# Patient Record
Sex: Female | Born: 1960
Health system: Southern US, Community
[De-identification: ages and names within clinical notes are randomized; demographics above are authoritative.]

## PROBLEM LIST (undated history)

## (undated) DIAGNOSIS — R7303 Prediabetes: Secondary | ICD-10-CM

## (undated) DIAGNOSIS — E119 Type 2 diabetes mellitus without complications: Secondary | ICD-10-CM

## (undated) DIAGNOSIS — I10 Essential (primary) hypertension: Secondary | ICD-10-CM

## (undated) HISTORY — PX: ABDOMINAL HYSTERECTOMY: SHX81

## (undated) HISTORY — DX: Essential (primary) hypertension: I10

---
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
## 2012-03-19 DIAGNOSIS — J309 Allergic rhinitis, unspecified: Secondary | ICD-10-CM | POA: Insufficient documentation

## 2012-03-19 DIAGNOSIS — H6590 Unspecified nonsuppurative otitis media, unspecified ear: Secondary | ICD-10-CM | POA: Insufficient documentation

## 2012-04-12 DIAGNOSIS — R7303 Prediabetes: Secondary | ICD-10-CM | POA: Insufficient documentation

## 2015-07-02 DIAGNOSIS — Z0000 Encounter for general adult medical examination without abnormal findings: Secondary | ICD-10-CM | POA: Insufficient documentation

## 2015-07-17 DIAGNOSIS — I10 Essential (primary) hypertension: Secondary | ICD-10-CM | POA: Insufficient documentation

## 2015-11-22 DIAGNOSIS — R7303 Prediabetes: Secondary | ICD-10-CM | POA: Diagnosis not present

## 2015-11-22 DIAGNOSIS — I10 Essential (primary) hypertension: Secondary | ICD-10-CM | POA: Diagnosis not present

## 2015-11-22 DIAGNOSIS — F321 Major depressive disorder, single episode, moderate: Secondary | ICD-10-CM | POA: Diagnosis not present

## 2015-11-22 DIAGNOSIS — F4321 Adjustment disorder with depressed mood: Secondary | ICD-10-CM | POA: Diagnosis not present

## 2016-01-03 DIAGNOSIS — I10 Essential (primary) hypertension: Secondary | ICD-10-CM | POA: Diagnosis not present

## 2016-01-06 DIAGNOSIS — F4321 Adjustment disorder with depressed mood: Secondary | ICD-10-CM | POA: Diagnosis not present

## 2016-01-06 DIAGNOSIS — Z634 Disappearance and death of family member: Secondary | ICD-10-CM | POA: Diagnosis not present

## 2016-01-06 DIAGNOSIS — I10 Essential (primary) hypertension: Secondary | ICD-10-CM | POA: Diagnosis not present

## 2016-01-06 DIAGNOSIS — R7303 Prediabetes: Secondary | ICD-10-CM | POA: Diagnosis not present

## 2016-01-12 DIAGNOSIS — Z79899 Other long term (current) drug therapy: Secondary | ICD-10-CM | POA: Diagnosis not present

## 2016-01-12 DIAGNOSIS — I10 Essential (primary) hypertension: Secondary | ICD-10-CM | POA: Diagnosis not present

## 2016-01-12 DIAGNOSIS — S81012A Laceration without foreign body, left knee, initial encounter: Secondary | ICD-10-CM | POA: Diagnosis not present

## 2016-03-16 DIAGNOSIS — Z789 Other specified health status: Secondary | ICD-10-CM | POA: Diagnosis not present

## 2016-03-16 DIAGNOSIS — Z9229 Personal history of other drug therapy: Secondary | ICD-10-CM | POA: Diagnosis not present

## 2016-03-17 DIAGNOSIS — Z23 Encounter for immunization: Secondary | ICD-10-CM | POA: Diagnosis not present

## 2016-03-19 DIAGNOSIS — Z23 Encounter for immunization: Secondary | ICD-10-CM | POA: Diagnosis not present

## 2016-09-18 DIAGNOSIS — S61431A Puncture wound without foreign body of right hand, initial encounter: Secondary | ICD-10-CM | POA: Diagnosis not present

## 2016-09-18 DIAGNOSIS — I10 Essential (primary) hypertension: Secondary | ICD-10-CM | POA: Diagnosis not present

## 2016-09-18 DIAGNOSIS — S61451A Open bite of right hand, initial encounter: Secondary | ICD-10-CM | POA: Diagnosis not present

## 2016-09-18 DIAGNOSIS — S61411A Laceration without foreign body of right hand, initial encounter: Secondary | ICD-10-CM | POA: Diagnosis not present

## 2016-09-18 DIAGNOSIS — M25531 Pain in right wrist: Secondary | ICD-10-CM | POA: Diagnosis not present

## 2017-03-30 DIAGNOSIS — I10 Essential (primary) hypertension: Secondary | ICD-10-CM | POA: Diagnosis not present

## 2017-04-06 DIAGNOSIS — R7303 Prediabetes: Secondary | ICD-10-CM | POA: Diagnosis not present

## 2017-04-06 DIAGNOSIS — Z1159 Encounter for screening for other viral diseases: Secondary | ICD-10-CM | POA: Diagnosis not present

## 2017-04-06 DIAGNOSIS — I10 Essential (primary) hypertension: Secondary | ICD-10-CM | POA: Diagnosis not present

## 2017-04-06 DIAGNOSIS — Z0000 Encounter for general adult medical examination without abnormal findings: Secondary | ICD-10-CM | POA: Diagnosis not present

## 2017-04-08 DIAGNOSIS — I10 Essential (primary) hypertension: Secondary | ICD-10-CM | POA: Diagnosis not present

## 2017-04-08 DIAGNOSIS — R7303 Prediabetes: Secondary | ICD-10-CM | POA: Diagnosis not present

## 2017-04-08 DIAGNOSIS — Z0000 Encounter for general adult medical examination without abnormal findings: Secondary | ICD-10-CM | POA: Diagnosis not present

## 2017-04-24 DIAGNOSIS — Z1231 Encounter for screening mammogram for malignant neoplasm of breast: Secondary | ICD-10-CM | POA: Diagnosis not present

## 2017-12-23 ENCOUNTER — Encounter: Payer: Self-pay | Admitting: Family Medicine

## 2017-12-23 ENCOUNTER — Ambulatory Visit (INDEPENDENT_AMBULATORY_CARE_PROVIDER_SITE_OTHER): Payer: No Typology Code available for payment source | Admitting: Family Medicine

## 2017-12-23 VITALS — BP 120/82 | HR 72 | Ht 59.0 in | Wt 131.0 lb

## 2017-12-23 DIAGNOSIS — R030 Elevated blood-pressure reading, without diagnosis of hypertension: Secondary | ICD-10-CM | POA: Diagnosis not present

## 2017-12-23 DIAGNOSIS — Z7689 Persons encountering health services in other specified circumstances: Secondary | ICD-10-CM

## 2017-12-23 NOTE — Patient Instructions (Signed)

## 2017-12-23 NOTE — Progress Notes (Signed)
Name: Caitlin Dean   MRN: [id number]    DOB: 12/24/1960   Date:12/23/2017       Progress Note  Subjective  Chief Complaint  Chief Complaint  Patient presents with  . Establish Care    moved due to ins  . Hypertension    needs B/P med refilled- off x 1 month    Hypertension  This is a chronic problem. The current episode started more than 1 year ago. The problem has been gradually worsening since onset. The problem is controlled. Pertinent negatives include no anxiety, blurred vision, chest pain, headaches, malaise/fatigue, neck pain, orthopnea, palpitations, peripheral edema, PND, shortness of breath or sweats. There are no associated agents to hypertension. Past treatments include diuretics (presently not taking hctz).    No problem-specific Assessment & Plan notes found for this encounter.   Past Medical History:  Diagnosis Date  . Hypertension     Past Surgical History:  Procedure Laterality Date  . ABDOMINAL HYSTERECTOMY      Family History  Problem Relation Age of Onset  . Hypertension Mother   . Cancer Paternal Grandfather     Social History   Socioeconomic History  . Marital status: Single    Spouse name: Not on file  . Number of children: Not on file  . Years of education: Not on file  . Highest education level: Not on file  Social Needs  . Financial resource strain: Not on file  . Food insecurity - worry: Not on file  . Food insecurity - inability: Not on file  . Transportation needs - medical: Not on file  . Transportation needs - non-medical: Not on file  Occupational History  . Not on file  Tobacco Use  . Smoking status: Never Smoker  . Smokeless tobacco: Never Used  Substance and Sexual Activity  . Alcohol use: No    Frequency: Never  . Drug use: No  . Sexual activity: Yes  Other Topics Concern  . Not on file  Social History Narrative  . Not on file    Allergies  Allergen Reactions  . Sulfa Antibiotics     Outpatient Medications  Prior to Visit  Medication Sig Dispense Refill  . hydrochlorothiazide (HYDRODIURIL) 12.5 MG tablet Take 12.5 mg by mouth daily.     No facility-administered medications prior to visit.     Review of Systems  Constitutional: Negative for chills, fever, malaise/fatigue and weight loss.  HENT: Negative for ear discharge, ear pain and sore throat.   Eyes: Negative for blurred vision.  Respiratory: Negative for cough, sputum production, shortness of breath and wheezing.   Cardiovascular: Negative for chest pain, palpitations, orthopnea, leg swelling and PND.  Gastrointestinal: Negative for abdominal pain, blood in stool, constipation, diarrhea, heartburn, melena and nausea.  Genitourinary: Negative for dysuria, frequency, hematuria and urgency.  Musculoskeletal: Negative for back pain, joint pain, myalgias and neck pain.  Skin: Negative for rash.  Neurological: Negative for dizziness, tingling, sensory change, focal weakness and headaches.  Endo/Heme/Allergies: Negative for environmental allergies and polydipsia. Does not bruise/bleed easily.  Psychiatric/Behavioral: Negative for depression and suicidal ideas. The patient is not nervous/anxious and does not have insomnia.      Objective  Vitals:   12/23/17 1452  BP: 120/82  Pulse: 72  Weight: 131 lb (59.4 kg)  Height: 4\' 11"  (1.499 m)    Physical Exam  Constitutional: She is well-developed, well-nourished, and in no distress. No distress.  HENT:  Head: Normocephalic and atraumatic.  Right Ear:  External ear normal.  Left Ear: External ear normal.  Nose: Nose normal.  Mouth/Throat: Oropharynx is clear and moist.  Eyes: Conjunctivae and EOM are normal. Pupils are equal, round, and reactive to light. Right eye exhibits no discharge. Left eye exhibits no discharge.  Neck: Normal range of motion. Neck supple. No JVD present. No thyromegaly present.  Cardiovascular: Normal rate, regular rhythm, normal heart sounds and intact distal  pulses. Exam reveals no gallop and no friction rub.  No murmur heard. Pulmonary/Chest: Effort normal and breath sounds normal. She has no wheezes. She has no rales.  Abdominal: Soft. Bowel sounds are normal. She exhibits no mass. There is no tenderness. There is no guarding.  Musculoskeletal: Normal range of motion. She exhibits no edema.  Lymphadenopathy:    She has no cervical adenopathy.  Neurological: She is alert. She has normal reflexes.  Skin: Skin is warm and dry. She is not diaphoretic.  Psychiatric: Mood and affect normal.  Nursing note and vitals reviewed.     Assessment & Plan  Problem List Items Addressed This Visit    None    Visit Diagnoses    Establishing care with new doctor, encounter for    -  Primary   Elevated blood-pressure reading without diagnosis of hypertension       diet controlled/recent weight loss      No orders of the defined types were placed in this encounter.     Dr. Hayden [facility]  12/23/17

## 2019-06-13 ENCOUNTER — Encounter: Payer: Self-pay | Admitting: Family Medicine

## 2019-06-13 ENCOUNTER — Ambulatory Visit (INDEPENDENT_AMBULATORY_CARE_PROVIDER_SITE_OTHER): Payer: No Typology Code available for payment source | Admitting: Family Medicine

## 2019-06-13 ENCOUNTER — Other Ambulatory Visit: Payer: Self-pay

## 2019-06-13 VITALS — BP 140/94 | HR 64 | Ht 59.0 in | Wt 144.0 lb

## 2019-06-13 DIAGNOSIS — R7303 Prediabetes: Secondary | ICD-10-CM | POA: Diagnosis not present

## 2019-06-13 DIAGNOSIS — Z7689 Persons encountering health services in other specified circumstances: Secondary | ICD-10-CM | POA: Diagnosis not present

## 2019-06-13 DIAGNOSIS — I10 Essential (primary) hypertension: Secondary | ICD-10-CM

## 2019-06-13 MED ORDER — HYDROCHLOROTHIAZIDE 12.5 MG PO TABS: 12.5000 mg | ORAL_TABLET | Freq: Every day | ORAL | 1 refills | Status: DC

## 2019-06-13 NOTE — Patient Instructions (Signed)
Carbohydrate Counting for Diabetes Mellitus, Adult  Carbohydrate counting is a method of keeping track of how many carbohydrates you eat. Eating carbohydrates naturally increases the amount of sugar (glucose) in the blood. Counting how many carbohydrates you eat helps keep your blood glucose within normal limits, which helps you manage your diabetes (diabetes mellitus). It is important to know how many carbohydrates you can safely have in each meal. This is different for every person. A diet and nutrition specialist (registered dietitian) can help you make a meal plan and calculate how many carbohydrates you should have at each meal and snack. Carbohydrates are found in the following foods:  Grains, such as breads and cereals.  Dried beans and soy products.  Starchy vegetables, such as potatoes, peas, and corn.  Fruit and fruit juices.  Milk and yogurt.  Sweets and snack foods, such as cake, cookies, candy, chips, and soft drinks. How do I count carbohydrates? There are two ways to count carbohydrates in food. You can use either of the methods or a combination of both. Reading "Nutrition Facts" on packaged food The "Nutrition Facts" list is included on the labels of almost all packaged foods and beverages in the [place] It includes:  The serving size.  Information about nutrients in each serving, including the grams (g) of carbohydrate per serving. To use the "Nutrition Facts":  Decide how many servings you will have.  Multiply the number of servings by the number of carbohydrates per serving.  The resulting number is the total amount of carbohydrates that you will be having. Learning standard serving sizes of other foods When you eat carbohydrate foods that are not packaged or do not include "Nutrition Facts" on the label, you need to measure the servings in order to count the amount of carbohydrates:  Measure the foods that you will eat with a food scale or measuring cup, if needed.   Decide how many standard-size servings you will eat.  Multiply the number of servings by 15. Most carbohydrate-rich foods have about 15 g of carbohydrates per serving. ? For example, if you eat 8 oz (170 g) of strawberries, you will have eaten 2 servings and 30 g of carbohydrates (2 servings x 15 g = 30 g).  For foods that have more than one food mixed, such as soups and casseroles, you must count the carbohydrates in each food that is included. The following list contains standard serving sizes of common carbohydrate-rich foods. Each of these servings has about 15 g of carbohydrates:   hamburger bun or  English muffin.   oz (15 mL) syrup.   oz (14 g) jelly.  1 slice of bread.  1 six-inch tortilla.  3 oz (85 g) cooked rice or pasta.  4 oz (113 g) cooked dried beans.  4 oz (113 g) starchy vegetable, such as peas, corn, or potatoes.  4 oz (113 g) hot cereal.  4 oz (113 g) mashed potatoes or  of a large baked potato.  4 oz (113 g) canned or frozen fruit.  4 oz (120 mL) fruit juice.  4-6 crackers.  6 chicken nuggets.  6 oz (170 g) unsweetened dry cereal.  6 oz (170 g) plain fat-free yogurt or yogurt sweetened with artificial sweeteners.  8 oz (240 mL) milk.  8 oz (170 g) fresh fruit or one small piece of fruit.  24 oz (680 g) popped popcorn. Example of carbohydrate counting Sample meal  3 oz (85 g) chicken breast.  6 oz (170 g)   brown rice.  4 oz (113 g) corn.  8 oz (240 mL) milk.  8 oz (170 g) strawberries with sugar-free whipped topping. Carbohydrate calculation 1. Identify the foods that contain carbohydrates: ? Rice. ? Corn. ? Milk. ? Strawberries. 2. Calculate how many servings you have of each food: ? 2 servings rice. ? 1 serving corn. ? 1 serving milk. ? 1 serving strawberries. 3. Multiply each number of servings by 15 g: ? 2 servings rice x 15 g = 30 g. ? 1 serving corn x 15 g = 15 g. ? 1 serving milk x 15 g = 15 g. ? 1 serving  strawberries x 15 g = 15 g. 4. Add together all of the amounts to find the total grams of carbohydrates eaten: ? 30 g + 15 g + 15 g + 15 g = 75 g of carbohydrates total. Summary  Carbohydrate counting is a method of keeping track of how many carbohydrates you eat.  Eating carbohydrates naturally increases the amount of sugar (glucose) in the blood.  Counting how many carbohydrates you eat helps keep your blood glucose within normal limits, which helps you manage your diabetes.  A diet and nutrition specialist (registered dietitian) can help you make a meal plan and calculate how many carbohydrates you should have at each meal and snack. This information is not intended to replace advice given to you by your health care provider. Make sure you discuss any questions you have with your health care provider. Document Released: 10/19/2005 Document Revised: 05/13/2017 Document Reviewed: 04/01/2016 Elsevier Patient Education  2020 [employer]  

## 2019-06-13 NOTE — Progress Notes (Signed)
Date:  06/13/2019   Name:  Caitlin Dean   DOB:  [birth year]   MRN:  [id number]   Chief Complaint: Establish Care (blood pressure was elevated at urgent care visit)  Patient is a 58 year old female who presents for an establishment of care exam. The patient reports the following problems: hypertension. Health maintenance has been reviewed mammogram//pap.   Review of Systems  Constitutional: Negative.  Negative for chills, fatigue, fever and unexpected weight change.  HENT: Negative for congestion, ear discharge, ear pain, rhinorrhea, sinus pressure, sneezing and sore throat.   Eyes: Negative for photophobia, pain, discharge, redness and itching.  Respiratory: Negative for cough, shortness of breath, wheezing and stridor.   Gastrointestinal: Negative for abdominal pain, blood in stool, constipation, diarrhea, nausea and vomiting.  Endocrine: Negative for cold intolerance, heat intolerance, polydipsia, polyphagia and polyuria.  Genitourinary: Negative for dysuria, flank pain, frequency, hematuria, menstrual problem, pelvic pain, urgency, vaginal bleeding and vaginal discharge.  Musculoskeletal: Negative for arthralgias, back pain and myalgias.  Skin: Negative for rash.  Allergic/Immunologic: Negative for environmental allergies and food allergies.  Neurological: Negative for dizziness, weakness, light-headedness, numbness and headaches.  Hematological: Negative for adenopathy. Does not bruise/bleed easily.  Psychiatric/Behavioral: Negative for dysphoric mood. The patient is not nervous/anxious.     Patient Active Problem List   Diagnosis Date Noted  . Essential hypertension 07/17/2015  . Routine history and physical examination of adult 07/02/2015  . Prediabetes 04/12/2012  . Allergic rhinitis 03/19/2012  . Middle ear effusion 03/19/2012    Allergies  Allergen Reactions  . Sulfa Antibiotics Rash    Past Surgical History:  Procedure Laterality Date  . ABDOMINAL  HYSTERECTOMY      Social History   Tobacco Use  . Smoking status: Never Smoker  . Smokeless tobacco: Never Used  Substance Use Topics  . Alcohol use: Not Currently  . Drug use: Never     Medication list has been reviewed and updated.  No outpatient medications have been marked as taking for the 06/13/19 encounter (Office Visit) with Duanne LimerickJones, Deanna C, MD.    [facility]  PHQ - 2 Score 0  PHQ- 9 Score 0    BP Readings from Last 3 Encounters:  06/13/19 (!) 140/94    Physical Exam Vitals signs and nursing note reviewed.  Constitutional:      Appearance: She is well-developed.  HENT:     Head: Normocephalic.     Right Ear: Hearing, tympanic membrane, ear canal and external ear normal.     Left Ear: Hearing, tympanic membrane, ear canal and external ear normal.     Nose: Nose normal. No congestion or rhinorrhea.     Mouth/Throat:     Lips: Pink.     Mouth: Mucous membranes are moist.     Pharynx: Oropharynx is clear. Uvula midline.  Eyes:     General: Lids are everted, no foreign bodies appreciated. No scleral icterus.       Left eye: No foreign body or hordeolum.     Conjunctiva/sclera: Conjunctivae normal.     Right eye: Right conjunctiva is not injected.     Left eye: Left conjunctiva is not injected.     Pupils: Pupils are equal, round, and reactive to light.  Neck:     Musculoskeletal: Normal range of motion and neck supple.     Thyroid: No thyromegaly.     Vascular: No JVD.     Trachea: No tracheal deviation.  Cardiovascular:     Rate and Rhythm: Normal rate and regular rhythm.     Chest Wall: PMI is not displaced.     Pulses: Normal pulses.     Heart sounds: S1 normal and S2 normal. Murmur present. Systolic murmur present with a grade of 1/6. No friction rub. No gallop. No S3 or S4 sounds.   Pulmonary:     Effort: Pulmonary effort is normal. No respiratory distress.     Breath sounds: Normal breath sounds. No wheezing or rales.  Abdominal:      General: Bowel sounds are normal.     Palpations: Abdomen is soft. There is no mass.     Tenderness: There is no abdominal tenderness. There is no guarding or rebound.  Musculoskeletal: Normal range of motion.        General: No tenderness.  Lymphadenopathy:     Cervical: No cervical adenopathy.  Skin:    General: Skin is warm.     Findings: No rash.  Neurological:     Mental Status: She is alert and oriented to person, place, and time.     Cranial Nerves: No cranial nerve deficit.     Deep Tendon Reflexes: Reflexes normal.  Psychiatric:        Mood and Affect: Mood is not anxious or depressed.     Wt Readings from Last 3 Encounters:  06/13/19 144 lb (65.3 kg)    BP (!) 140/94   Pulse 64   Ht 4\' 11"  (1.499 m)   Wt 144 lb (65.3 kg)   BMI 29.08 kg/m   Assessment and Plan:  1. Establishing care with new doctor, encounter for Patient establishing care with a new physician today.  Patient has not been on medication for hypertension nor a possible prediabetes diagnosis.  We reviewed previous encounters and labs.  2. Essential hypertension Chronic.  Patient was told that she did not need to take any further hydrochlorothiazide presumably because she had lost weight initially was in the weight training.  Blood pressure is elevated today and will resume hydrochlorothiazide 12.5 mg once a day. - hydrochlorothiazide (HYDRODIURIL) 12.5 MG tablet; Take 1 tablet (12.5 mg total) by mouth daily.  Dispense: 90 tablet; Refill: 1  3. Prediabetes Review of previous A1c is noted to be in the 5.1-5.3 range hopefully there is likely be significantly decreased when we recheck this in 6 weeks in the meantime patient was given information on limiting carbohydrates.

## 2019-06-16 ENCOUNTER — Encounter: Payer: Self-pay | Admitting: Family Medicine

## 2019-07-31 ENCOUNTER — Ambulatory Visit: Payer: No Typology Code available for payment source | Admitting: Family Medicine

## 2019-08-02 ENCOUNTER — Other Ambulatory Visit: Payer: Self-pay

## 2019-08-02 ENCOUNTER — Encounter: Payer: Self-pay | Admitting: Family Medicine

## 2019-08-02 ENCOUNTER — Ambulatory Visit (INDEPENDENT_AMBULATORY_CARE_PROVIDER_SITE_OTHER): Payer: No Typology Code available for payment source | Admitting: Family Medicine

## 2019-08-02 VITALS — BP 120/72 | HR 72 | Ht 59.0 in | Wt 146.0 lb

## 2019-08-02 DIAGNOSIS — E7801 Familial hypercholesterolemia: Secondary | ICD-10-CM

## 2019-08-02 DIAGNOSIS — R7303 Prediabetes: Secondary | ICD-10-CM | POA: Diagnosis not present

## 2019-08-02 DIAGNOSIS — I10 Essential (primary) hypertension: Secondary | ICD-10-CM | POA: Diagnosis not present

## 2019-08-02 DIAGNOSIS — E663 Overweight: Secondary | ICD-10-CM | POA: Diagnosis not present

## 2019-08-02 MED ORDER — HYDROCHLOROTHIAZIDE 12.5 MG PO TABS: 12.5000 mg | ORAL_TABLET | Freq: Every day | ORAL | 1 refills | Status: DC

## 2019-08-02 NOTE — Patient Instructions (Signed)

## 2019-08-02 NOTE — Progress Notes (Signed)
Date:  08/02/2019   Name:  Caitlin Dean   DOB:  Mar 02, 1961   MRN:  [id number]   Chief Complaint: Hypertension (follow up from starting med)  Hypertension This is a chronic problem. The current episode started more than 1 year ago. The problem has been gradually improving since onset. The problem is controlled. Pertinent negatives include no anxiety, blurred vision, chest pain, headaches, malaise/fatigue, neck pain, orthopnea, palpitations, peripheral edema, PND, shortness of breath or sweats. Risk factors for coronary artery disease include dyslipidemia. The current treatment provides moderate improvement. There are no compliance problems.  There is no history of angina, kidney disease, CVA, heart failure, left ventricular hypertrophy or PVD.  Diabetes She presents for her follow-up (prediabetes) diabetic visit. She has type 2 diabetes mellitus. Her disease course has been stable. There are no hypoglycemic associated symptoms. Pertinent negatives for hypoglycemia include no dizziness, headaches, nervousness/anxiousness or sweats. Pertinent negatives for diabetes include no blurred vision, no chest pain, no fatigue, no foot paresthesias, no polydipsia, no polyphagia, no polyuria and no weakness. There are no hypoglycemic complications. Symptoms are stable. There are no diabetic complications. Pertinent negatives for diabetic complications include no CVA or PVD. Risk factors for coronary artery disease include dyslipidemia and hypertension. Her weight is stable. She is following a generally unhealthy diet. Meal planning includes avoidance of concentrated sweets and carbohydrate counting. She participates in exercise daily. An ACE inhibitor/angiotensin II receptor blocker is being taken.  Hyperlipidemia This is a chronic problem. The current episode started more than 1 year ago. The problem is uncontrolled. Recent lipid tests were reviewed and are variable. Exacerbating diseases include diabetes.  Factors aggravating her hyperlipidemia include thiazides. Pertinent negatives include no chest pain, myalgias or shortness of breath. Current antihyperlipidemic treatment includes diet change. The current treatment provides moderate improvement of lipids. There are no compliance problems.     Review of Systems  Constitutional: Negative.  Negative for chills, fatigue, fever, malaise/fatigue and unexpected weight change.  HENT: Negative for congestion, ear discharge, ear pain, rhinorrhea, sinus pressure, sneezing and sore throat.   Eyes: Negative for blurred vision, photophobia, pain, discharge, redness and itching.  Respiratory: Negative for cough, shortness of breath, wheezing and stridor.   Cardiovascular: Negative for chest pain, palpitations, orthopnea and PND.  Gastrointestinal: Negative for abdominal pain, blood in stool, constipation, diarrhea, nausea and vomiting.  Endocrine: Negative for cold intolerance, heat intolerance, polydipsia, polyphagia and polyuria.  Genitourinary: Negative for dysuria, flank pain, frequency, hematuria, menstrual problem, pelvic pain, urgency, vaginal bleeding and vaginal discharge.  Musculoskeletal: Negative for arthralgias, back pain, myalgias and neck pain.  Skin: Negative for rash.  Allergic/Immunologic: Negative for environmental allergies and food allergies.  Neurological: Negative for dizziness, weakness, light-headedness, numbness and headaches.  Hematological: Negative for adenopathy. Does not bruise/bleed easily.  Psychiatric/Behavioral: Negative for dysphoric mood. The patient is not nervous/anxious.     Patient Active Problem List   Diagnosis Date Noted  . Essential hypertension 07/17/2015  . Routine history and physical examination of adult 07/02/2015  . Prediabetes 04/12/2012  . Allergic rhinitis 03/19/2012  . Middle ear effusion 03/19/2012    Allergies  Allergen Reactions  . Sulfa Antibiotics   . Sulfa Antibiotics Rash    Past  Surgical History:  Procedure Laterality Date  . ABDOMINAL HYSTERECTOMY      Social History   Tobacco Use  . Smoking status: Never Smoker  . Smokeless tobacco: Never Used  Substance Use Topics  . Alcohol use: Not Currently  Frequency: Never  . Drug use: Never     Medication list has been reviewed and updated.  Current Meds  Medication Sig  . hydrochlorothiazide (HYDRODIURIL) 12.5 MG tablet Take 1 tablet (12.5 mg total) by mouth daily.    PHQ 2/9 Scores 08/02/2019 06/13/2019 12/23/2017  PHQ - 2 Score 0 0 0  PHQ- 9 Score 0 0 0    BP Readings from Last 3 Encounters:  08/02/19 120/72  06/13/19 (!) 140/94  12/23/17 120/82    Physical Exam Vitals signs and nursing note reviewed.  Constitutional:      Appearance: She is well-developed.  HENT:     Head: Normocephalic.     Right Ear: External ear normal.     Left Ear: External ear normal.  Eyes:     General: Lids are everted, no foreign bodies appreciated. No scleral icterus.       Left eye: No foreign body or hordeolum.     Conjunctiva/sclera: Conjunctivae normal.     Right eye: Right conjunctiva is not injected.     Left eye: Left conjunctiva is not injected.     Pupils: Pupils are equal, round, and reactive to light.  Neck:     Musculoskeletal: Normal range of motion and neck supple.     Thyroid: No thyromegaly.     Vascular: No JVD.     Trachea: No tracheal deviation.  Cardiovascular:     Rate and Rhythm: Normal rate and regular rhythm.     Heart sounds: Normal heart sounds. No murmur. No friction rub. No gallop.   Pulmonary:     Effort: Pulmonary effort is normal. No respiratory distress.     Breath sounds: Normal breath sounds. No wheezing or rales.  Abdominal:     General: Bowel sounds are normal.     Palpations: Abdomen is soft. There is no mass.     Tenderness: There is no abdominal tenderness. There is no guarding or rebound.  Musculoskeletal: Normal range of motion.        General: No tenderness.   Lymphadenopathy:     Cervical: No cervical adenopathy.  Skin:    General: Skin is warm.     Findings: No rash.  Neurological:     Mental Status: She is alert and oriented to person, place, and time.     Cranial Nerves: No cranial nerve deficit.     Deep Tendon Reflexes: Reflexes normal.  Psychiatric:        Mood and Affect: Mood is not anxious or depressed.     Wt Readings from Last 3 Encounters:  08/02/19 146 lb (66.2 kg)  06/13/19 144 lb (65.3 kg)  12/23/17 131 lb (59.4 kg)    BP 120/72   Pulse 72   Ht 4\' 11"  (1.499 m)   Wt 146 lb (66.2 kg)   BMI 29.49 kg/m   Assessment and Plan: 1. Essential hypertension Chronic.  Controlled.  Continue hydrochlorothiazide 12.5 mg once a day.  Will check renal function panel. - hydrochlorothiazide (HYDRODIURIL) 12.5 MG tablet; Take 1 tablet (12.5 mg total) by mouth daily.  Dispense: 90 tablet; Refill: 1 - Renal Function Panel  2. Prediabetes Chronic.  Controlled.  Patient has a history of prediabetes we will check an A1c and microalbuminuria as well as renal function panel to assess GFR. - Renal Function Panel - Hemoglobin A1c - Microalbumin / creatinine urine ratio  3. Familial hypercholesterolemia Chronic.  Diet controlled.  Will check lipid panel given the nature of her  prediabetes. - Lipid Panel With LDL/HDL Ratio  4. Overweight (BMI 25.0-29.9) Patient was given Mediterranean diet.Health risks of being over weight were discussed and patient was counseled on weight loss options and exercise.

## 2019-08-03 LAB — RENAL FUNCTION PANEL
Albumin: 4.7 g/dL (ref 3.8–4.9)
BUN/Creatinine Ratio: 19 (ref 9–23)
BUN: 18 mg/dL (ref 6–24)
CO2: 27 mmol/L (ref 20–29)
Calcium: 9.1 mg/dL (ref 8.7–10.2)
Chloride: 104 mmol/L (ref 96–106)
Creatinine, Ser: 0.96 mg/dL (ref 0.57–1.00)
GFR calc Af Amer: 76 mL/min/{1.73_m2} (ref >59)
GFR calc non Af Amer: 66 mL/min/{1.73_m2} (ref >59)
Glucose: 84 mg/dL (ref 65–99)
Phosphorus: 3.3 mg/dL (ref 3.0–4.3)
Potassium: 4.5 mmol/L (ref 3.5–5.2)
Sodium: 144 mmol/L (ref 134–144)

## 2019-08-03 LAB — LIPID PANEL WITH LDL/HDL RATIO
Cholesterol, Total: 184 mg/dL (ref 100–199)
HDL: 72 mg/dL (ref >39)
LDL Chol Calc (NIH): 102 mg/dL — ABNORMAL HIGH (ref 0–99)
LDL/HDL Ratio: 1.4 ratio (ref 0.0–3.2)
Triglycerides: 50 mg/dL (ref 0–149)
VLDL Cholesterol Cal: 10 mg/dL (ref 5–40)

## 2019-08-03 LAB — MICROALBUMIN / CREATININE URINE RATIO
Creatinine, Urine: 207.2 mg/dL
Microalb/Creat Ratio: 3 mg/g creat (ref 0–29)
Microalbumin, Urine: 5.7 ug/mL

## 2019-08-03 LAB — HEMOGLOBIN A1C
Est. average glucose Bld gHb Est-mCnc: 123 mg/dL
Hgb A1c MFr Bld: 5.9 % — ABNORMAL HIGH (ref 4.8–5.6)

## 2019-12-09 ENCOUNTER — Other Ambulatory Visit: Payer: Self-pay | Admitting: Family Medicine

## 2019-12-09 DIAGNOSIS — I10 Essential (primary) hypertension: Secondary | ICD-10-CM

## 2019-12-28 ENCOUNTER — Ambulatory Visit: Payer: No Typology Code available for payment source | Admitting: Family Medicine

## 2020-01-11 ENCOUNTER — Ambulatory Visit (INDEPENDENT_AMBULATORY_CARE_PROVIDER_SITE_OTHER): Payer: No Typology Code available for payment source | Admitting: Family Medicine

## 2020-01-11 ENCOUNTER — Other Ambulatory Visit: Payer: Self-pay

## 2020-01-11 ENCOUNTER — Encounter: Payer: Self-pay | Admitting: Family Medicine

## 2020-01-11 VITALS — BP 122/82 | HR 80 | Ht 59.0 in | Wt 153.0 lb

## 2020-01-11 DIAGNOSIS — I10 Essential (primary) hypertension: Secondary | ICD-10-CM | POA: Diagnosis not present

## 2020-01-11 DIAGNOSIS — Z1231 Encounter for screening mammogram for malignant neoplasm of breast: Secondary | ICD-10-CM

## 2020-01-11 DIAGNOSIS — R7303 Prediabetes: Secondary | ICD-10-CM | POA: Diagnosis not present

## 2020-01-11 DIAGNOSIS — E7801 Familial hypercholesterolemia: Secondary | ICD-10-CM | POA: Diagnosis not present

## 2020-01-11 DIAGNOSIS — E669 Obesity, unspecified: Secondary | ICD-10-CM | POA: Diagnosis not present

## 2020-01-11 MED ORDER — HYDROCHLOROTHIAZIDE 12.5 MG PO TABS: 12.5000 mg | ORAL_TABLET | Freq: Every day | ORAL | 1 refills | Status: DC

## 2020-01-11 NOTE — Progress Notes (Signed)
Date:  01/11/2020   Name:  Caitlin Dean   DOB:  1961/05/24   MRN:  [id number]   Chief Complaint: Hypertension and prediabetes (5.9 in Sept)  Hypertension This is a chronic problem. The current episode started more than 1 year ago. The problem has been gradually improving since onset. The problem is controlled. Pertinent negatives include no anxiety, blurred vision, chest pain, headaches, malaise/fatigue, neck pain, orthopnea, palpitations, peripheral edema, PND, shortness of breath or sweats. There are no associated agents to hypertension. There are no known risk factors for coronary artery disease. Past treatments include diuretics. The current treatment provides moderate improvement. There are no compliance problems.  There is no history of angina, kidney disease, CVA, heart failure, PVD or retinopathy. There is no history of chronic renal disease, a hypertension causing med or renovascular disease.  Hyperlipidemia This is a chronic problem. The current episode started more than 1 year ago. The problem is controlled. Recent lipid tests were reviewed and are normal. Exacerbating diseases include diabetes and obesity. She has no history of chronic renal disease, hypothyroidism, liver disease or nephrotic syndrome. Factors aggravating her hyperlipidemia include thiazides. Pertinent negatives include no chest pain, focal sensory loss, focal weakness, leg pain, myalgias or shortness of breath. Current antihyperlipidemic treatment includes diet change. The current treatment provides moderate improvement of lipids. There are no compliance problems.  Risk factors for coronary artery disease include hypertension, post-menopausal and diabetes mellitus.    Lab Results  Component Value Date   CREATININE 0.96 08/02/2019   BUN 18 08/02/2019   NA 144 08/02/2019   K 4.5 08/02/2019   CL 104 08/02/2019   CO2 27 08/02/2019   Lab Results  Component Value Date   CHOL 184 08/02/2019   HDL 72 08/02/2019    LDLCALC 102 (H) 08/02/2019   TRIG 50 08/02/2019   No results found for: TSH Lab Results  Component Value Date   HGBA1C 5.9 (H) 08/02/2019     Review of Systems  Constitutional: Negative for chills, fever and malaise/fatigue.  HENT: Negative for drooling, ear discharge, ear pain and sore throat.   Eyes: Negative for blurred vision.  Respiratory: Negative for cough, shortness of breath and wheezing.   Cardiovascular: Negative for chest pain, palpitations, orthopnea, leg swelling and PND.  Gastrointestinal: Negative for abdominal pain, blood in stool, constipation, diarrhea and nausea.  Endocrine: Negative for polydipsia.  Genitourinary: Negative for dysuria, frequency, hematuria and urgency.  Musculoskeletal: Negative for back pain, myalgias and neck pain.  Skin: Negative for rash.  Allergic/Immunologic: Negative for environmental allergies.  Neurological: Negative for dizziness, focal weakness and headaches.  Hematological: Does not bruise/bleed easily.  Psychiatric/Behavioral: Negative for suicidal ideas. The patient is not nervous/anxious.     Patient Active Problem List   Diagnosis Date Noted  . Essential hypertension 07/17/2015  . Routine history and physical examination of adult 07/02/2015  . Prediabetes 04/12/2012  . Allergic rhinitis 03/19/2012  . Middle ear effusion 03/19/2012    Allergies  Allergen Reactions  . Sulfa Antibiotics   . Sulfa Antibiotics Rash    Past Surgical History:  Procedure Laterality Date  . ABDOMINAL HYSTERECTOMY      Social History   Tobacco Use  . Smoking status: Never Smoker  . Smokeless tobacco: Never Used  Substance Use Topics  . Alcohol use: Not Currently  . Drug use: Never     Medication list has been reviewed and updated.  Current Meds  Medication Sig  . hydrochlorothiazide (  HYDRODIURIL) 12.5 MG tablet Take 1 tablet by mouth once daily    PHQ 2/9 Scores 01/11/2020 08/02/2019 06/13/2019 12/23/2017  PHQ - 2 Score 0 0 0  0  PHQ- 9 Score 0 0 0 0    BP Readings from Last 3 Encounters:  01/11/20 122/82  08/02/19 120/72  06/13/19 (!) 140/94    Physical Exam Vitals and nursing note reviewed.  Constitutional:      Appearance: She is well-developed.  HENT:     Head: Normocephalic.     Right Ear: Tympanic membrane and external ear normal.     Left Ear: Tympanic membrane and external ear normal.  Eyes:     General: Lids are everted, no foreign bodies appreciated. No scleral icterus.       Left eye: No foreign body or hordeolum.     Conjunctiva/sclera: Conjunctivae normal.     Right eye: Right conjunctiva is not injected.     Left eye: Left conjunctiva is not injected.     Pupils: Pupils are equal, round, and reactive to light.  Neck:     Thyroid: No thyromegaly.     Vascular: No JVD.     Trachea: No tracheal deviation.  Cardiovascular:     Rate and Rhythm: Normal rate and regular rhythm.     Heart sounds: Normal heart sounds. No murmur. No friction rub. No gallop.   Pulmonary:     Effort: Pulmonary effort is normal. No respiratory distress.     Breath sounds: Normal breath sounds. No wheezing, rhonchi or rales.  Chest:     Chest wall: No mass.     Breasts:        Right: Normal. No swelling, bleeding, inverted nipple, mass, nipple discharge, skin change or tenderness.        Left: Normal. No swelling, bleeding, inverted nipple, mass, nipple discharge, skin change or tenderness.  Abdominal:     General: Bowel sounds are normal.     Palpations: Abdomen is soft. There is no mass.     Tenderness: There is no abdominal tenderness. There is no guarding or rebound.  Musculoskeletal:        General: No tenderness. Normal range of motion.     Cervical back: Normal range of motion and neck supple.  Lymphadenopathy:     Cervical: No cervical adenopathy.     Upper Body:     Right upper body: No supraclavicular or axillary adenopathy.     Left upper body: No supraclavicular or axillary adenopathy.   Skin:    General: Skin is warm.     Capillary Refill: Capillary refill takes less than 2 seconds.     Findings: No erythema or rash.  Neurological:     Mental Status: She is alert and oriented to person, place, and time.     Cranial Nerves: No cranial nerve deficit.     Deep Tendon Reflexes: Reflexes normal.  Psychiatric:        Mood and Affect: Mood is not anxious or depressed.     Wt Readings from Last 3 Encounters:  01/11/20 153 lb (69.4 kg)  08/02/19 146 lb (66.2 kg)  06/13/19 144 lb (65.3 kg)    BP 122/82   Pulse 80   Ht 4\' 11"  (1.499 m)   Wt 153 lb (69.4 kg)   BMI 30.90 kg/m   Assessment and Plan:  1. Essential hypertension Chronic.  Controlled.  Stable.  Continue hydrochlorothiazide 12.5 mg once a day.  Check of the  previous labs noted renal function to be normal and will repeat in 6 months - hydrochlorothiazide (HYDRODIURIL) 12.5 MG tablet; Take 1 tablet (12.5 mg total) by mouth daily.  Dispense: 90 tablet; Refill: 1  2. Prediabetes Chronic.  Controlled.  Stable.  Will recheck A1c to determine if still in a reasonable control range otherwise we will consider Metformin in the future if A1c has increased.  3. Familial hypercholesterolemia Chronic.  Controlled.  Stable.  Review the previous lipid panel notes to be controlled on dietary measures.  We will continue with low-cholesterol sheets.  We will recheck lipid in 6 months.  4. Obesity (BMI 30.0-34.9) Health risks of being over weight were discussed and patient was counseled on weight loss options and exercise.  5. Encounter for screening mammogram for malignant neoplasm of breast Patient had a manual exam done for breast mass which was normal.  Patient was unable to receive any evidence of previous mammogram done in the past.  Patient will discuss with mammogram downstairs today about rescheduling without having compared to views due to their unavailability.

## 2020-01-18 ENCOUNTER — Ambulatory Visit: Payer: No Typology Code available for payment source | Admitting: Family Medicine

## 2020-06-13 ENCOUNTER — Encounter: Payer: Self-pay | Admitting: Family Medicine

## 2020-06-13 ENCOUNTER — Ambulatory Visit (INDEPENDENT_AMBULATORY_CARE_PROVIDER_SITE_OTHER): Payer: No Typology Code available for payment source | Admitting: Family Medicine

## 2020-06-13 ENCOUNTER — Other Ambulatory Visit: Payer: Self-pay

## 2020-06-13 VITALS — BP 130/96 | HR 66 | Ht 59.0 in | Wt 148.0 lb

## 2020-06-13 DIAGNOSIS — R7303 Prediabetes: Secondary | ICD-10-CM

## 2020-06-13 DIAGNOSIS — I10 Essential (primary) hypertension: Secondary | ICD-10-CM

## 2020-06-13 DIAGNOSIS — E7801 Familial hypercholesterolemia: Secondary | ICD-10-CM | POA: Diagnosis not present

## 2020-06-13 MED ORDER — HYDROCHLOROTHIAZIDE 25 MG PO TABS: 25.0000 mg | ORAL_TABLET | Freq: Every day | ORAL | 3 refills | Status: DC

## 2020-06-13 MED ORDER — HYDROCHLOROTHIAZIDE 12.5 MG PO TABS: 12.5000 mg | ORAL_TABLET | Freq: Every day | ORAL | 3 refills | Status: DC

## 2020-06-13 NOTE — Progress Notes (Signed)
Date:  06/13/2020   Name:  Caitlin Dean   DOB:  17-Jun-1961   MRN:  [id number]   Chief Complaint: Hypertension  Hypertension This is a chronic problem. The current episode started more than 1 year ago. The problem has been gradually improving since onset. The problem is controlled. Pertinent negatives include no anxiety, blurred vision, chest pain, headaches, malaise/fatigue, neck pain, orthopnea, palpitations, peripheral edema, PND, shortness of breath or sweats. There are no associated agents to hypertension. Risk factors for coronary artery disease include diabetes mellitus. Past treatments include ACE inhibitors.  Diabetes She presents for her follow-up diabetic visit. Pertinent negatives for hypoglycemia include no dizziness, headaches, nervousness/anxiousness or sweats. Pertinent negatives for diabetes include no blurred vision, no chest pain, no fatigue, no polydipsia, no polyphagia, no polyuria and no weakness. Current diabetic treatment includes diet. She is compliant with treatment all of the time.  Hyperlipidemia This is a chronic problem. The current episode started more than 1 year ago. The problem is controlled. Pertinent negatives include no chest pain, myalgias or shortness of breath. There are no compliance problems.  Risk factors for coronary artery disease include hypertension and dyslipidemia.    Lab Results  Component Value Date   CREATININE 0.96 08/02/2019   BUN 18 08/02/2019   NA 144 08/02/2019   K 4.5 08/02/2019   CL 104 08/02/2019   CO2 27 08/02/2019   Lab Results  Component Value Date   CHOL 184 08/02/2019   HDL 72 08/02/2019   LDLCALC 102 (H) 08/02/2019   TRIG 50 08/02/2019   No results found for: TSH Lab Results  Component Value Date   HGBA1C 5.9 (H) 08/02/2019   No results found for: WBC, HGB, HCT, MCV, PLT No results found for: ALT, AST, GGT, ALKPHOS, BILITOT   Review of Systems  Constitutional: Negative.  Negative for chills, fatigue,  fever, malaise/fatigue and unexpected weight change.  HENT: Negative for congestion, ear discharge, ear pain, rhinorrhea, sinus pressure, sneezing and sore throat.   Eyes: Negative for blurred vision, photophobia, pain, discharge, redness and itching.  Respiratory: Negative for cough, shortness of breath, wheezing and stridor.   Cardiovascular: Negative for chest pain, palpitations, orthopnea and PND.  Gastrointestinal: Negative for abdominal pain, blood in stool, constipation, diarrhea, nausea and vomiting.  Endocrine: Negative for cold intolerance, heat intolerance, polydipsia, polyphagia and polyuria.  Genitourinary: Negative for dysuria, flank pain, frequency, hematuria, menstrual problem, pelvic pain, urgency, vaginal bleeding and vaginal discharge.  Musculoskeletal: Negative for arthralgias, back pain, myalgias and neck pain.  Skin: Negative for rash.  Allergic/Immunologic: Negative for environmental allergies and food allergies.  Neurological: Negative for dizziness, weakness, light-headedness, numbness and headaches.  Hematological: Negative for adenopathy. Does not bruise/bleed easily.  Psychiatric/Behavioral: Negative for dysphoric mood. The patient is not nervous/anxious.     Patient Active Problem List   Diagnosis Date Noted  . Essential hypertension 07/17/2015  . Routine history and physical examination of adult 07/02/2015  . Prediabetes 04/12/2012  . Allergic rhinitis 03/19/2012  . Middle ear effusion 03/19/2012    Allergies  Allergen Reactions  . Sulfa Antibiotics   . Sulfa Antibiotics Rash    Past Surgical History:  Procedure Laterality Date  . ABDOMINAL HYSTERECTOMY      Social History   Tobacco Use  . Smoking status: Never Smoker  . Smokeless tobacco: Never Used  Substance Use Topics  . Alcohol use: Not Currently  . Drug use: Never     Medication list has been  reviewed and updated.  Current Meds  Medication Sig  . hydrochlorothiazide (HYDRODIURIL)  12.5 MG tablet Take 1 tablet (12.5 mg total) by mouth daily.    PHQ 2/9 Scores 06/13/2020 01/11/2020 08/02/2019 06/13/2019  PHQ - 2 Score 0 0 0 0  PHQ- 9 Score 0 0 0 0    GAD 7 : Generalized Anxiety Score 06/13/2020 01/11/2020  Nervous, Anxious, on Edge 0 0  Control/stop worrying 0 0  Worry too much - different things 0 0  Trouble relaxing 0 0  Restless 0 0  Easily annoyed or irritable 0 0  Afraid - awful might happen 0 0  Total GAD 7 Score 0 0    BP Readings from Last 3 Encounters:  06/13/20 (!) 130/96  01/11/20 122/82  08/02/19 120/72    Physical Exam Vitals and nursing note reviewed.  Constitutional:      Appearance: She is well-developed.  HENT:     Head: Normocephalic.     Right Ear: Tympanic membrane, ear canal and external ear normal.     Left Ear: Tympanic membrane, ear canal and external ear normal.     Nose: Nose normal.  Eyes:     General: Lids are everted, no foreign bodies appreciated. No scleral icterus.       Left eye: No foreign body or hordeolum.     Conjunctiva/sclera: Conjunctivae normal.     Right eye: Right conjunctiva is not injected.     Left eye: Left conjunctiva is not injected.     Pupils: Pupils are equal, round, and reactive to light.  Neck:     Thyroid: No thyromegaly.     Vascular: No JVD.     Trachea: No tracheal deviation.  Cardiovascular:     Rate and Rhythm: Normal rate and regular rhythm.     Heart sounds: Normal heart sounds. No murmur heard.  No friction rub. No gallop.   Pulmonary:     Effort: Pulmonary effort is normal. No respiratory distress.     Breath sounds: Normal breath sounds. No wheezing or rales.  Abdominal:     General: Bowel sounds are normal. There is no distension.     Palpations: Abdomen is soft. There is no mass.     Tenderness: There is no abdominal tenderness. There is no guarding or rebound.  Musculoskeletal:        General: No tenderness. Normal range of motion.     Cervical back: Normal range of motion and  neck supple.  Feet:     Right foot:     Protective Sensation: 10 sites tested. 10 sites sensed.     Skin integrity: Skin integrity normal.     Left foot:     Protective Sensation: 10 sites tested. 10 sites sensed.     Skin integrity: Skin integrity normal.  Lymphadenopathy:     Cervical: No cervical adenopathy.  Skin:    General: Skin is warm.     Findings: No rash.  Neurological:     Mental Status: She is alert and oriented to person, place, and time.     Cranial Nerves: No cranial nerve deficit.     Deep Tendon Reflexes: Reflexes normal.  Psychiatric:        Mood and Affect: Mood is not anxious or depressed.     Wt Readings from Last 3 Encounters:  06/13/20 148 lb (67.1 kg)  01/11/20 153 lb (69.4 kg)  08/02/19 146 lb (66.2 kg)    BP (!) 130/96  Pulse 66   Ht 4\' 11"  (1.499 m)   Wt 148 lb (67.1 kg)   BMI 29.89 kg/m   Assessment and Plan: 1. Essential hypertension Chronic.  Controlled.  Stable.  There is still elevation of blood pressure.  Patient is currently on 12.5 mg and will increase this to 25 mg once a day. - hydrochlorothiazide (HYDRODIURIL) 25 MG tablet; Take 1 tablet (25 mg total) by mouth daily.  Dispense: 90 tablet; Refill: 3  2. Prediabetes Patient with history of prediabetes and foot exam was unremarkable.  Patient will be returning for physical exam at which time we will do lab work that will include cooperate A1c.  3. Familial hypercholesterolemia Patient with history of hypercholesterolemia.  At this point in time patient will be returning for a physical in a fasting state at which time we will draw a lipid panel which would be more meaningful.

## 2020-06-13 NOTE — Patient Instructions (Signed)
DASH Eating Plan DASH stands for "Dietary Approaches to Stop Hypertension." The DASH eating plan is a healthy eating plan that has been shown to reduce high blood pressure (hypertension). It may also reduce your risk for type 2 diabetes, heart disease, and stroke. The DASH eating plan may also help with weight loss. What are tips for following this plan?  General guidelines  Avoid eating more than 2,300 mg (milligrams) of salt (sodium) a day. If you have hypertension, you may need to reduce your sodium intake to 1,500 mg a day.  Limit alcohol intake to no more than 1 drink a day for nonpregnant women and 2 drinks a day for men. One drink equals 12 oz of beer, 5 oz of wine, or 1 oz of hard liquor.  Work with your health care provider to maintain a healthy body weight or to lose weight. Ask what an ideal weight is for you.  Get at least 30 minutes of exercise that causes your heart to beat faster (aerobic exercise) most days of the week. Activities may include walking, swimming, or biking.  Work with your health care provider or diet and nutrition specialist (dietitian) to adjust your eating plan to your individual calorie needs. Reading food labels   Check food labels for the amount of sodium per serving. Choose foods with less than 5 percent of the Daily Value of sodium. Generally, foods with less than 300 mg of sodium per serving fit into this eating plan.  To find whole grains, look for the word "whole" as the first word in the ingredient list. Shopping  Buy products labeled as "low-sodium" or "no salt added."  Buy fresh foods. Avoid canned foods and premade or frozen meals. Cooking  Avoid adding salt when cooking. Use salt-free seasonings or herbs instead of table salt or sea salt. Check with your health care provider or pharmacist before using salt substitutes.  Do not fry foods. Cook foods using healthy methods such as baking, boiling, grilling, and broiling instead.  Cook with  heart-healthy oils, such as olive, canola, soybean, or sunflower oil. Meal planning  Eat a balanced diet that includes: ? 5 or more servings of fruits and vegetables each day. At each meal, try to fill half of your plate with fruits and vegetables. ? Up to 6-8 servings of whole grains each day. ? Less than 6 oz of lean meat, poultry, or fish each day. A 3-oz serving of meat is about the same size as a deck of cards. One egg equals 1 oz. ? 2 servings of low-fat dairy each day. ? A serving of nuts, seeds, or beans 5 times each week. ? Heart-healthy fats. Healthy fats called Omega-3 fatty acids are found in foods such as flaxseeds and coldwater fish, like sardines, salmon, and mackerel.  Limit how much you eat of the following: ? Canned or prepackaged foods. ? Food that is high in trans fat, such as fried foods. ? Food that is high in saturated fat, such as fatty meat. ? Sweets, desserts, sugary drinks, and other foods with added sugar. ? Full-fat dairy products.  Do not salt foods before eating.  Try to eat at least 2 vegetarian meals each week.  Eat more home-cooked food and less restaurant, buffet, and fast food.  When eating at a restaurant, ask that your food be prepared with less salt or no salt, if possible. What foods are recommended? The items listed may not be a complete list. Talk with your dietitian about   what dietary choices are best for you. Grains Whole-grain or whole-wheat bread. Whole-grain or whole-wheat pasta. Brown rice. Oatmeal. Quinoa. Bulgur. Whole-grain and low-sodium cereals. Pita bread. Low-fat, low-sodium crackers. Whole-wheat flour tortillas. Vegetables Fresh or frozen vegetables (raw, steamed, roasted, or grilled). Low-sodium or reduced-sodium tomato and vegetable juice. Low-sodium or reduced-sodium tomato sauce and tomato paste. Low-sodium or reduced-sodium canned vegetables. Fruits All fresh, dried, or frozen fruit. Canned fruit in natural juice (without  added sugar). Meat and other protein foods Skinless chicken or turkey. Ground chicken or turkey. Pork with fat trimmed off. Fish and seafood. Egg whites. Dried beans, peas, or lentils. Unsalted nuts, nut butters, and seeds. Unsalted canned beans. Lean cuts of beef with fat trimmed off. Low-sodium, lean deli meat. Dairy Low-fat (1%) or fat-free (skim) milk. Fat-free, low-fat, or reduced-fat cheeses. Nonfat, low-sodium ricotta or cottage cheese. Low-fat or nonfat yogurt. Low-fat, low-sodium cheese. Fats and oils Soft margarine without trans fats. Vegetable oil. Low-fat, reduced-fat, or light mayonnaise and salad dressings (reduced-sodium). Canola, safflower, olive, soybean, and sunflower oils. Avocado. Seasoning and other foods Herbs. Spices. Seasoning mixes without salt. Unsalted popcorn and pretzels. Fat-free sweets. What foods are not recommended? The items listed may not be a complete list. Talk with your dietitian about what dietary choices are best for you. Grains Baked goods made with fat, such as croissants, muffins, or some breads. Dry pasta or rice meal packs. Vegetables Creamed or fried vegetables. Vegetables in a cheese sauce. Regular canned vegetables (not low-sodium or reduced-sodium). Regular canned tomato sauce and paste (not low-sodium or reduced-sodium). Regular tomato and vegetable juice (not low-sodium or reduced-sodium). Pickles. Olives. Fruits Canned fruit in a light or heavy syrup. Fried fruit. Fruit in cream or butter sauce. Meat and other protein foods Fatty cuts of meat. Ribs. Fried meat. Bacon. Sausage. Bologna and other processed lunch meats. Salami. Fatback. Hotdogs. Bratwurst. Salted nuts and seeds. Canned beans with added salt. Canned or smoked fish. Whole eggs or egg yolks. Chicken or turkey with skin. Dairy Whole or 2% milk, cream, and half-and-half. Whole or full-fat cream cheese. Whole-fat or sweetened yogurt. Full-fat cheese. Nondairy creamers. Whipped toppings.  Processed cheese and cheese spreads. Fats and oils Butter. Stick margarine. Lard. Shortening. Ghee. Bacon fat. Tropical oils, such as coconut, palm kernel, or palm oil. Seasoning and other foods Salted popcorn and pretzels. Onion salt, garlic salt, seasoned salt, table salt, and sea salt. Worcestershire sauce. Tartar sauce. Barbecue sauce. Teriyaki sauce. Soy sauce, including reduced-sodium. Steak sauce. Canned and packaged gravies. Fish sauce. Oyster sauce. Cocktail sauce. Horseradish that you find on the shelf. Ketchup. Mustard. Meat flavorings and tenderizers. Bouillon cubes. Hot sauce and Tabasco sauce. Premade or packaged marinades. Premade or packaged taco seasonings. Relishes. Regular salad dressings. Where to find more information:  National Heart, Lung, and Blood Institute: www.nhlbi.nih.gov  American Heart Association: www.heart.org Summary  The DASH eating plan is a healthy eating plan that has been shown to reduce high blood pressure (hypertension). It may also reduce your risk for type 2 diabetes, heart disease, and stroke.  With the DASH eating plan, you should limit salt (sodium) intake to 2,300 mg a day. If you have hypertension, you may need to reduce your sodium intake to 1,500 mg a day.  When on the DASH eating plan, aim to eat more fresh fruits and vegetables, whole grains, lean proteins, low-fat dairy, and heart-healthy fats.  Work with your health care provider or diet and nutrition specialist (dietitian) to adjust your eating plan to your   individual calorie needs. This information is not intended to replace advice given to you by your health care provider. Make sure you discuss any questions you have with your health care provider. Document Revised: 10/01/2017 Document Reviewed: 10/12/2016 Elsevier Patient Education  2020 [employer]  

## 2020-06-19 ENCOUNTER — Other Ambulatory Visit: Payer: Self-pay

## 2020-06-19 ENCOUNTER — Encounter: Payer: Self-pay | Admitting: Family Medicine

## 2020-06-19 ENCOUNTER — Ambulatory Visit (INDEPENDENT_AMBULATORY_CARE_PROVIDER_SITE_OTHER): Payer: No Typology Code available for payment source | Admitting: Family Medicine

## 2020-06-19 VITALS — BP 128/78 | HR 67 | Ht 59.0 in | Wt 147.0 lb

## 2020-06-19 DIAGNOSIS — Z1231 Encounter for screening mammogram for malignant neoplasm of breast: Secondary | ICD-10-CM

## 2020-06-19 DIAGNOSIS — Z0000 Encounter for general adult medical examination without abnormal findings: Secondary | ICD-10-CM

## 2020-06-19 DIAGNOSIS — Z6829 Body mass index (BMI) 29.0-29.9, adult: Secondary | ICD-10-CM | POA: Diagnosis not present

## 2020-06-19 DIAGNOSIS — Z1211 Encounter for screening for malignant neoplasm of colon: Secondary | ICD-10-CM | POA: Diagnosis not present

## 2020-06-19 DIAGNOSIS — R7303 Prediabetes: Secondary | ICD-10-CM

## 2020-06-19 NOTE — Progress Notes (Signed)
Date:  06/19/2020   Name:  Caitlin Dean   DOB:  06-03-1961   MRN:  [id number]   Chief Complaint: Annual Exam (Breast Exam, no papsmear- hysterectomy. ) and Colon Cancer Screening (Needs referral for colonoscopy. )  Patient is a 59 year old female who presents for a comprehensive physical exam. The patient reports the following problems: noe. Health maintenance has been reviewed needs mammogram.   Lab Results  Component Value Date   CREATININE 0.96 08/02/2019   BUN 18 08/02/2019   NA 144 08/02/2019   K 4.5 08/02/2019   CL 104 08/02/2019   CO2 27 08/02/2019   Lab Results  Component Value Date   CHOL 184 08/02/2019   HDL 72 08/02/2019   LDLCALC 102 (H) 08/02/2019   TRIG 50 08/02/2019   No results found for: TSH Lab Results  Component Value Date   HGBA1C 5.9 (H) 08/02/2019   No results found for: WBC, HGB, HCT, MCV, PLT No results found for: ALT, AST, GGT, ALKPHOS, BILITOT   Review of Systems  Constitutional: Negative.  Negative for chills, fatigue, fever and unexpected weight change.  HENT: Negative for congestion, ear discharge, ear pain, rhinorrhea, sinus pressure, sneezing and sore throat.   Eyes: Negative for photophobia, pain, discharge, redness and itching.  Respiratory: Negative for cough, shortness of breath, wheezing and stridor.   Gastrointestinal: Negative for abdominal pain, blood in stool, constipation, diarrhea, nausea and vomiting.  Endocrine: Negative for cold intolerance, heat intolerance, polydipsia, polyphagia and polyuria.  Genitourinary: Negative for dysuria, flank pain, frequency, hematuria, menstrual problem, pelvic pain, urgency, vaginal bleeding and vaginal discharge.  Musculoskeletal: Negative for arthralgias, back pain and myalgias.  Skin: Negative for rash.  Allergic/Immunologic: Negative for environmental allergies and food allergies.  Neurological: Negative for dizziness, weakness, light-headedness, numbness and headaches.    Hematological: Negative for adenopathy. Does not bruise/bleed easily.  Psychiatric/Behavioral: Negative for dysphoric mood. The patient is not nervous/anxious.     Patient Active Problem List   Diagnosis Date Noted  . Essential hypertension 07/17/2015  . Routine history and physical examination of adult 07/02/2015  . Prediabetes 04/12/2012  . Allergic rhinitis 03/19/2012  . Middle ear effusion 03/19/2012    Allergies  Allergen Reactions  . Sulfa Antibiotics   . Sulfa Antibiotics Rash    Past Surgical History:  Procedure Laterality Date  . ABDOMINAL HYSTERECTOMY      Social History   Tobacco Use  . Smoking status: Never Smoker  . Smokeless tobacco: Never Used  Substance Use Topics  . Alcohol use: Not Currently  . Drug use: Never     Medication list has been reviewed and updated.  Current Meds  Medication Sig  . hydrochlorothiazide (HYDRODIURIL) 25 MG tablet Take 1 tablet (25 mg total) by mouth daily.    PHQ 2/9 Scores 06/13/2020 01/11/2020 08/02/2019 06/13/2019  PHQ - 2 Score 0 0 0 0  PHQ- 9 Score 0 0 0 0    GAD 7 : Generalized Anxiety Score 06/13/2020 01/11/2020  Nervous, Anxious, on Edge 0 0  Control/stop worrying 0 0  Worry too much - different things 0 0  Trouble relaxing 0 0  Restless 0 0  Easily annoyed or irritable 0 0  Afraid - awful might happen 0 0  Total GAD 7 Score 0 0    BP Readings from Last 3 Encounters:  06/19/20 128/78  06/13/20 (!) 130/96  01/11/20 122/82    Physical Exam Vitals and nursing note reviewed.  Constitutional:      Appearance: Normal appearance. She is well-developed, well-groomed and normal weight.  HENT:     Head: Normocephalic.     Jaw: There is normal jaw occlusion.     Right Ear: Hearing, tympanic membrane, ear canal and external ear normal.     Left Ear: Hearing, tympanic membrane, ear canal and external ear normal.     Nose: Nose normal.     Mouth/Throat:     Lips: Pink.     Mouth: Mucous membranes are moist.      Dentition: Normal dentition.     Tongue: No lesions.     Palate: No mass.     Pharynx: Oropharynx is clear. Uvula midline.  Eyes:     General: Lids are normal. Vision grossly intact. No scleral icterus.       Left eye: No foreign body or hordeolum.     Extraocular Movements: Extraocular movements intact.     Conjunctiva/sclera: Conjunctivae normal.     Right eye: Right conjunctiva is not injected.     Left eye: Left conjunctiva is not injected.     Pupils: Pupils are equal, round, and reactive to light.  Neck:     Thyroid: No thyroid mass or thyromegaly.     Vascular: No JVD.     Trachea: No tracheal deviation.  Cardiovascular:     Rate and Rhythm: Normal rate and regular rhythm.     Chest Wall: PMI is not displaced.     Pulses: Normal pulses.          Carotid pulses are 2+ on the right side and 2+ on the left side.      Radial pulses are 2+ on the right side and 2+ on the left side.       Femoral pulses are 2+ on the right side and 2+ on the left side.      Popliteal pulses are 2+ on the right side and 2+ on the left side.       Dorsalis pedis pulses are 2+ on the right side and 2+ on the left side.       Posterior tibial pulses are 2+ on the right side and 2+ on the left side.     Heart sounds: Normal heart sounds, S1 normal and S2 normal. No murmur heard.  No systolic murmur is present.  No diastolic murmur is present.  No friction rub. No gallop. No S3 or S4 sounds.   Pulmonary:     Effort: Pulmonary effort is normal. No respiratory distress.     Breath sounds: Normal breath sounds. No decreased breath sounds, wheezing, rhonchi or rales.  Chest:     Breasts:        Right: Normal. No swelling, bleeding, inverted nipple, mass, nipple discharge, skin change or tenderness.        Left: Normal. No swelling, bleeding, inverted nipple, mass, nipple discharge, skin change or tenderness.  Abdominal:     General: Bowel sounds are normal.     Palpations: Abdomen is soft. There is  no hepatomegaly, splenomegaly or mass.     Tenderness: There is no abdominal tenderness. There is no guarding or rebound.     Hernia: There is no hernia in the umbilical area or ventral area.  Genitourinary:    Rectum: Normal. Guaiac result negative. No mass.  Musculoskeletal:        General: No tenderness. Normal range of motion.     Cervical back: Normal, full  passive range of motion without pain, normal range of motion and neck supple.     Thoracic back: Normal.     Lumbar back: Normal.  Lymphadenopathy:     Head:     Right side of head: No submental or submandibular adenopathy.     Left side of head: No submental or submandibular adenopathy.     Cervical: No cervical adenopathy.     Right cervical: No superficial cervical adenopathy.    Left cervical: No superficial cervical adenopathy.     Upper Body:     Right upper body: No supraclavicular, axillary or pectoral adenopathy.     Left upper body: No supraclavicular, axillary or pectoral adenopathy.  Skin:    General: Skin is warm.     Capillary Refill: Capillary refill takes less than 2 seconds.     Findings: No rash.  Neurological:     Mental Status: She is alert and oriented to person, place, and time.     Cranial Nerves: Cranial nerves are intact. No cranial nerve deficit.     Sensory: Sensation is intact.     Motor: Motor function is intact.     Coordination: Romberg sign negative.     Deep Tendon Reflexes: Reflexes normal.  Psychiatric:        Attention and Perception: Attention and perception normal.        Mood and Affect: Mood and affect normal. Mood is not anxious or depressed.        Speech: Speech normal.        Behavior: Behavior is cooperative.     Wt Readings from Last 3 Encounters:  06/19/20 147 lb (66.7 kg)  06/13/20 148 lb (67.1 kg)  01/11/20 153 lb (69.4 kg)    BP 128/78   Pulse 67   Ht 4\' 11"  (1.499 m)   Wt 147 lb (66.7 kg)   SpO2 98%   BMI 29.69 kg/m   Assessment and Plan: 1. Annual  physical exam No subjective objective concerns noted on history and physical exam.  Patient's chart was reviewed for encounters, most recent labs, most recent imaging, and care everywhere.Caitlin Dean is a 59 y.o. female who presents today for her Complete Annual Exam. She feels well. She reports exercising . She reports she is sleeping well. Immunizations are reviewed and recommendations provided.   Age appropriate screening tests are discussed. Counseling given for risk factor reduction interventions.  We will do labs including CBC, CMP, and lipid panel. - CBC with Differential/Platelet - Comprehensive metabolic panel - Lipid Panel With LDL/HDL Ratio  2. Encounter for screening mammogram for malignant neoplasm of breast Breast exam normal without palpable masses.  Patient patient is agreed for bilateral mammogram screening. - MM 3D SCREEN BREAST BILATERAL; Future  3. Adult BMI 29.0-29.9 kg/sq m Health risks of being over weight were discussed and patient was counseled on weight loss options and exercise.  Patient given weight loss diet.  4. Colon cancer screening Gust with patient a referral to gastroenterology was made for screening colonoscopy. - Ambulatory referral to Gastroenterology  5. Prediabetes Patient has history of prediabetes with A1c within prediabetic range we will recheck A1c today. - Hemoglobin A1c

## 2020-06-19 NOTE — Patient Instructions (Signed)

## 2020-06-20 LAB — CBC WITH DIFFERENTIAL/PLATELET
Basophils Absolute: 0 10*3/uL (ref 0.0–0.2)
Basos: 1 %
EOS (ABSOLUTE): 0 10*3/uL (ref 0.0–0.4)
Eos: 1 %
Hematocrit: 40.1 % (ref 34.0–46.6)
Hemoglobin: 13.3 g/dL (ref 11.1–15.9)
Immature Grans (Abs): 0 10*3/uL (ref 0.0–0.1)
Immature Granulocytes: 0 %
Lymphocytes Absolute: 1.8 10*3/uL (ref 0.7–3.1)
Lymphs: 44 %
MCH: 29.1 pg (ref 26.6–33.0)
MCHC: 33.2 g/dL (ref 31.5–35.7)
MCV: 88 fL (ref 79–97)
Monocytes Absolute: 0.3 10*3/uL (ref 0.1–0.9)
Monocytes: 9 %
Neutrophils Absolute: 1.8 10*3/uL (ref 1.4–7.0)
Neutrophils: 45 %
Platelets: 206 10*3/uL (ref 150–450)
RBC: 4.57 x10E6/uL (ref 3.77–5.28)
RDW: 13.4 % (ref 11.7–15.4)
WBC: 4 10*3/uL (ref 3.4–10.8)

## 2020-06-20 LAB — COMPREHENSIVE METABOLIC PANEL
ALT: 16 IU/L (ref 0–32)
AST: 22 IU/L (ref 0–40)
Albumin/Globulin Ratio: 2.3 — ABNORMAL HIGH (ref 1.2–2.2)
Albumin: 4.5 g/dL (ref 3.8–4.9)
Alkaline Phosphatase: 62 IU/L (ref 48–121)
BUN/Creatinine Ratio: 17 (ref 9–23)
BUN: 16 mg/dL (ref 6–24)
Bilirubin Total: 0.3 mg/dL (ref 0.0–1.2)
CO2: 28 mmol/L (ref 20–29)
Calcium: 9.3 mg/dL (ref 8.7–10.2)
Chloride: 106 mmol/L (ref 96–106)
Creatinine, Ser: 0.96 mg/dL (ref 0.57–1.00)
GFR calc Af Amer: 75 mL/min/{1.73_m2} (ref >59)
GFR calc non Af Amer: 65 mL/min/{1.73_m2} (ref >59)
Globulin, Total: 2 g/dL (ref 1.5–4.5)
Glucose: 90 mg/dL (ref 65–99)
Potassium: 4 mmol/L (ref 3.5–5.2)
Sodium: 145 mmol/L — ABNORMAL HIGH (ref 134–144)
Total Protein: 6.5 g/dL (ref 6.0–8.5)

## 2020-06-20 LAB — LIPID PANEL WITH LDL/HDL RATIO
Cholesterol, Total: 193 mg/dL (ref 100–199)
HDL: 71 mg/dL (ref >39)
LDL Chol Calc (NIH): 112 mg/dL — ABNORMAL HIGH (ref 0–99)
LDL/HDL Ratio: 1.6 ratio (ref 0.0–3.2)
Triglycerides: 54 mg/dL (ref 0–149)
VLDL Cholesterol Cal: 10 mg/dL (ref 5–40)

## 2020-06-20 LAB — HEMOGLOBIN A1C
Est. average glucose Bld gHb Est-mCnc: 120 mg/dL
Hgb A1c MFr Bld: 5.8 % — ABNORMAL HIGH (ref 4.8–5.6)

## 2020-07-02 ENCOUNTER — Telehealth (INDEPENDENT_AMBULATORY_CARE_PROVIDER_SITE_OTHER): Payer: Self-pay | Admitting: Gastroenterology

## 2020-07-02 DIAGNOSIS — Z1211 Encounter for screening for malignant neoplasm of colon: Secondary | ICD-10-CM

## 2020-07-02 MED ORDER — NA SULFATE-K SULFATE-MG SULF 17.5-3.13-1.6 GM/177ML PO SOLN: 1.0000 | Freq: Once | ORAL | 0 refills | Status: AC

## 2020-07-02 NOTE — Progress Notes (Signed)
Gastroenterology Pre-Procedure Review  Request Date: Thursday 07/11/20 Requesting Physician: Dr. Allen Norris  PATIENT REVIEW QUESTIONS: The patient responded to the following health history questions as indicated:    1. Are you having any GI issues? no 2. Do you have a personal history of Polyps? no 3. Do you have a family history of Colon Cancer or Polyps? no 4. Diabetes Mellitus? no 5. Joint replacements in the past 12 months?no 6. Major health problems in the past 3 months?no 7. Any artificial heart valves, MVP, or defibrillator?no    MEDICATIONS & ALLERGIES:    Patient reports the following regarding taking any anticoagulation/antiplatelet therapy:   Plavix, Coumadin, Eliquis, Xarelto, Lovenox, Pradaxa, Brilinta, or Effient? no Aspirin? no  Patient confirms/reports the following medications:  Current Outpatient Medications  Medication Sig Dispense Refill  . hydrochlorothiazide (HYDRODIURIL) 25 MG tablet Take 1 tablet (25 mg total) by mouth daily. 90 tablet 3  . Na Sulfate-K Sulfate-Mg Sulf 17.5-3.13-1.6 GM/177ML SOLN Take 1 kit by mouth once for 1 dose. 354 mL 0   No current facility-administered medications for this visit.    Patient confirms/reports the following allergies:  Allergies  Allergen Reactions  . Sulfa Antibiotics   . Sulfa Antibiotics Rash    Orders Placed This Encounter  Procedures  . Procedural/ Surgical Case Request: COLONOSCOPY WITH PROPOFOL    Standing Status:   Standing    Number of Occurrences:   1    Order Specific Question:   Pre-op diagnosis    Answer:   screening colonoscopy    Order Specific Question:   CPT Code    Answer:   24097    AUTHORIZATION INFORMATION Primary Insurance: 1D#: Group #:  Secondary Insurance: 1D#: Group #:  SCHEDULE INFORMATION: Date: 07/11/20 Time: Location:[facility]

## 2020-07-03 ENCOUNTER — Other Ambulatory Visit: Payer: Self-pay

## 2020-07-03 ENCOUNTER — Telehealth: Payer: No Typology Code available for payment source

## 2020-07-03 DIAGNOSIS — Z1211 Encounter for screening for malignant neoplasm of colon: Secondary | ICD-10-CM

## 2020-07-04 ENCOUNTER — Encounter: Payer: Self-pay | Admitting: Gastroenterology

## 2020-07-04 ENCOUNTER — Other Ambulatory Visit: Payer: Self-pay

## 2020-07-09 ENCOUNTER — Ambulatory Visit
Admission: RE | Admit: 2020-07-09 | Discharge: 2020-07-09 | Disposition: A | Discharge status: Home / Self Care | Payer: No Typology Code available for payment source | Source: Ambulatory Visit | Attending: Family Medicine | Admitting: Family Medicine

## 2020-07-09 ENCOUNTER — Other Ambulatory Visit: Payer: Self-pay

## 2020-07-09 ENCOUNTER — Other Ambulatory Visit
Admission: RE | Admit: 2020-07-09 | Discharge: 2020-07-09 | Disposition: A | Discharge status: Home / Self Care | Payer: No Typology Code available for payment source | Source: Ambulatory Visit | Attending: Family Medicine | Admitting: Family Medicine

## 2020-07-09 DIAGNOSIS — Z1231 Encounter for screening mammogram for malignant neoplasm of breast: Secondary | ICD-10-CM | POA: Diagnosis present

## 2020-07-09 DIAGNOSIS — Z20822 Contact with and (suspected) exposure to covid-19: Secondary | ICD-10-CM | POA: Insufficient documentation

## 2020-07-09 DIAGNOSIS — Z01812 Encounter for preprocedural laboratory examination: Secondary | ICD-10-CM | POA: Insufficient documentation

## 2020-07-10 LAB — SARS CORONAVIRUS 2 (TAT 6-24 HRS): SARS Coronavirus 2: NEGATIVE

## 2020-07-10 NOTE — Discharge Instructions (Signed)
General Anesthesia, Adult, Care After This sheet gives you information about how to care for yourself after your procedure. Your health care provider may also give you more specific instructions. If you have problems or questions, contact your health care provider. What can I expect after the procedure? After the procedure, the following side effects are common:  Pain or discomfort at the IV site.  Nausea.  Vomiting.  Sore throat.  Trouble concentrating.  Feeling cold or chills.  Weak or tired.  Sleepiness and fatigue.  Soreness and body aches. These side effects can affect parts of the body that were not involved in surgery. Follow these instructions at home:  For at least 24 hours after the procedure:  Have a responsible adult stay with you. It is important to have someone help care for you until you are awake and alert.  Rest as needed.  Do not: ? Participate in activities in which you could fall or become injured. ? Drive. ? Use heavy machinery. ? Drink alcohol. ? Take sleeping pills or medicines that cause drowsiness. ? Make important decisions or sign legal documents. ? Take care of children on your own. Eating and drinking  Follow any instructions from your health care provider about eating or drinking restrictions.  When you feel hungry, start by eating small amounts of foods that are soft and easy to digest (bland), such as toast. Gradually return to your regular diet.  Drink enough fluid to keep your urine pale yellow.  If you vomit, rehydrate by drinking water, juice, or clear broth. General instructions  If you have sleep apnea, surgery and certain medicines can increase your risk for breathing problems. Follow instructions from your health care provider about wearing your sleep device: ? Anytime you are sleeping, including during daytime naps. ? While taking prescription pain medicines, sleeping medicines, or medicines that make you drowsy.  Return to  your normal activities as told by your health care provider. Ask your health care provider what activities are safe for you.  Take over-the-counter and prescription medicines only as told by your health care provider.  If you smoke, do not smoke without supervision.  Keep all follow-up visits as told by your health care provider. This is important. Contact a health care provider if:  You have nausea or vomiting that does not get better with medicine.  You cannot eat or drink without vomiting.  You have pain that does not get better with medicine.  You are unable to pass urine.  You develop a skin rash.  You have a fever.  You have redness around your IV site that gets worse. Get help right away if:  You have difficulty breathing.  You have chest pain.  You have blood in your urine or stool, or you vomit blood. Summary  After the procedure, it is common to have a sore throat or nausea. It is also common to feel tired.  Have a responsible adult stay with you for the first 24 hours after general anesthesia. It is important to have someone help care for you until you are awake and alert.  When you feel hungry, start by eating small amounts of foods that are soft and easy to digest (bland), such as toast. Gradually return to your regular diet.  Drink enough fluid to keep your urine pale yellow.  Return to your normal activities as told by your health care provider. Ask your health care provider what activities are safe for you. This information is not   intended to replace advice given to you by your health care provider. Make sure you discuss any questions you have with your health care provider. Document Revised: 10/22/2017 Document Reviewed: 06/04/2017 Elsevier Patient Education  2020 [employer]  

## 2020-07-11 ENCOUNTER — Ambulatory Visit: Payer: No Typology Code available for payment source | Admitting: Anesthesiology

## 2020-07-11 ENCOUNTER — Encounter: Payer: Self-pay | Admitting: Gastroenterology

## 2020-07-11 ENCOUNTER — Encounter
Admission: RE | Disposition: A | Discharge status: Home / Self Care | Payer: Self-pay | Source: Home / Self Care | Attending: Gastroenterology

## 2020-07-11 ENCOUNTER — Ambulatory Visit
Admission: RE | Admit: 2020-07-11 | Discharge: 2020-07-11 | Disposition: A | Discharge status: Home / Self Care | Payer: No Typology Code available for payment source | Attending: Gastroenterology | Admitting: Gastroenterology

## 2020-07-11 ENCOUNTER — Other Ambulatory Visit: Payer: Self-pay

## 2020-07-11 DIAGNOSIS — Z1211 Encounter for screening for malignant neoplasm of colon: Secondary | ICD-10-CM | POA: Diagnosis present

## 2020-07-11 DIAGNOSIS — R7303 Prediabetes: Secondary | ICD-10-CM | POA: Insufficient documentation

## 2020-07-11 DIAGNOSIS — Z882 Allergy status to sulfonamides status: Secondary | ICD-10-CM | POA: Diagnosis not present

## 2020-07-11 DIAGNOSIS — I10 Essential (primary) hypertension: Secondary | ICD-10-CM | POA: Insufficient documentation

## 2020-07-11 DIAGNOSIS — K640 First degree hemorrhoids: Secondary | ICD-10-CM | POA: Diagnosis not present

## 2020-07-11 HISTORY — PX: COLONOSCOPY WITH PROPOFOL: SHX5780

## 2020-07-11 HISTORY — DX: Prediabetes: R73.03

## 2020-07-11 SURGERY — COLONOSCOPY WITH PROPOFOL
Anesthesia: Choice | Site: Rectum

## 2020-07-11 MED ORDER — LIDOCAINE HCL (CARDIAC) PF 100 MG/5ML IV SOSY
PREFILLED_SYRINGE | INTRAVENOUS | Status: DC | PRN
  Administered 2020-07-11: 60 mg via INTRAVENOUS

## 2020-07-11 MED ORDER — OXYCODONE HCL 5 MG PO TABS: 5.0000 mg | ORAL_TABLET | Freq: Once | ORAL | Status: DC | PRN

## 2020-07-11 MED ORDER — OXYCODONE HCL 5 MG/5ML PO SOLN: 5.0000 mg | Freq: Once | ORAL | Status: DC | PRN

## 2020-07-11 MED ORDER — PROPOFOL 10 MG/ML IV BOLUS
INTRAVENOUS | Status: DC | PRN
  Administered 2020-07-11: 60 mg via INTRAVENOUS
  Administered 2020-07-11: 30 mg via INTRAVENOUS
  Administered 2020-07-11: 70 mg via INTRAVENOUS

## 2020-07-11 MED ORDER — STERILE WATER FOR IRRIGATION IR SOLN
Status: DC | PRN
  Administered 2020-07-11: .05 mL

## 2020-07-11 MED ORDER — LACTATED RINGERS IV SOLN: INTRAVENOUS | Status: DC

## 2020-07-11 SURGICAL SUPPLY — 5 items
GOWN CVR UNV OPN BCK APRN NK (MISCELLANEOUS) ×2 IMPLANT
GOWN ISOL THUMB LOOP REG UNIV (MISCELLANEOUS) ×4
KIT ENDO PROCEDURE OLY (KITS) ×2 IMPLANT
MANIFOLD NEPTUNE II (INSTRUMENTS) ×2 IMPLANT
WATER STERILE IRR 250ML POUR (IV SOLUTION) ×2 IMPLANT

## 2020-07-11 NOTE — Anesthesia Preprocedure Evaluation (Signed)
Anesthesia Evaluation  Patient identified by MRN, date of birth, ID band Patient awake    Reviewed: Allergy & Precautions, H&P , NPO status , Patient's Chart, lab work & pertinent test results  Airway Mallampati: I  TM Distance: >3 FB Neck ROM: full    Dental no notable dental hx.    Pulmonary neg pulmonary ROS,    Pulmonary exam normal        Cardiovascular hypertension, Normal cardiovascular exam Rhythm:regular Rate:Normal     Neuro/Psych    GI/Hepatic negative GI ROS, Neg liver ROS,   Endo/Other    Renal/GU negative Renal ROS     Musculoskeletal   Abdominal   Peds  Hematology negative hematology ROS (+)   Anesthesia Other Findings   Reproductive/Obstetrics negative OB ROS                             Anesthesia Physical Anesthesia Plan  ASA: II  Anesthesia Plan:    Post-op Pain Management:    Induction:   PONV Risk Score and Plan:   Airway Management Planned:   Additional Equipment:   Intra-op Plan:   Post-operative Plan:   Informed Consent: I have reviewed the patients History and Physical, chart, labs and discussed the procedure including the risks, benefits and alternatives for the proposed anesthesia with the patient or authorized representative who has indicated his/her understanding and acceptance.       Plan Discussed with:   Anesthesia Plan Comments:         Anesthesia Quick Evaluation

## 2020-07-11 NOTE — Anesthesia Postprocedure Evaluation (Signed)
Anesthesia Post Note  Patient: Caitlin Dean  Procedure(s) Performed: COLONOSCOPY WITH PROPOFOL (N/A Rectum)     Patient location during evaluation: PACU Anesthesia Type: General Level of consciousness: awake and alert Pain management: pain level controlled Vital Signs Assessment: post-procedure vital signs reviewed and stable Respiratory status: spontaneous breathing Cardiovascular status: stable Anesthetic complications: no   No complications documented.  Marvis Repress

## 2020-07-11 NOTE — Anesthesia Procedure Notes (Signed)
Procedure Name: MAC Date/Time: 07/11/2020 9:30 AM Performed by: Silvana Newness, CRNA Pre-anesthesia Checklist: Patient identified, Emergency Drugs available, Suction available, Patient being monitored and Timeout performed Patient Re-evaluated:Patient Re-evaluated prior to induction Oxygen Delivery Method: Nasal cannula Induction Type: IV induction Placement Confirmation: positive ETCO2

## 2020-07-11 NOTE — Transfer of Care (Signed)
Immediate Anesthesia Transfer of Care Note  Patient: Caitlin Dean  Procedure(s) Performed: COLONOSCOPY WITH PROPOFOL (N/A Rectum)  Patient Location: PACU  Anesthesia Type: No value filed.  Level of Consciousness: awake, alert  and patient cooperative  Airway and Oxygen Therapy: Patient Spontanous Breathing and Patient connected to supplemental oxygen  Post-op Assessment: Post-op Vital signs reviewed, Patient's Cardiovascular Status Stable, Respiratory Function Stable, Patent Airway and No signs of Nausea or vomiting  Post-op Vital Signs: Reviewed and stable  Complications: No complications documented.

## 2020-07-11 NOTE — Op Note (Signed)
[facility] Gastroenterology Patient Name: Caitlin Dean Procedure Date: 07/11/2020 9:18 AM MRN: [id number] Account #: [id number] Date of Birth: October 18, 1961 Admit Type: Outpatient Age: 59 Room: [facility] OR ROOM 01 Gender: Female Note Status: Finalized Procedure:             Colonoscopy Indications:           Screening for colorectal malignant neoplasm Providers:             Midge Minium MD, MD Referring MD:          Duanne Limerick, MD (Referring MD) Medicines:             Propofol per Anesthesia Complications:         No immediate complications. Procedure:             Pre-Anesthesia Assessment:                        - Prior to the procedure, a History and Physical was                         performed, and patient medications and allergies were                         reviewed. The patient's tolerance of previous                         anesthesia was also reviewed. The risks and benefits                         of the procedure and the sedation options and risks                         were discussed with the patient. All questions were                         answered, and informed consent was obtained. Prior                         Anticoagulants: The patient has taken no previous                         anticoagulant or antiplatelet agents. ASA Grade                         Assessment: II - A patient with mild systemic disease.                         After reviewing the risks and benefits, the patient                         was deemed in satisfactory condition to undergo the                         procedure.                        After obtaining informed consent, the colonoscope was  passed under direct vision. Throughout the procedure,                         the patient's blood pressure, pulse, and oxygen                         saturations were monitored continuously. The was                         introduced through the anus and  advanced to the the                         cecum, identified by appendiceal orifice and ileocecal                         valve. The colonoscopy was performed without                         difficulty. The patient tolerated the procedure well.                         The quality of the bowel preparation was excellent. Findings:      The perianal and digital rectal examinations were normal.      Non-bleeding internal hemorrhoids were found during retroflexion. The       hemorrhoids were Grade I (internal hemorrhoids that do not prolapse). Impression:            - Non-bleeding internal hemorrhoids.                        - No specimens collected. Recommendation:        - Await pathology results.                        - Discharge patient to home.                        - Resume previous diet.                        - Continue present medications. Procedure Code(s):     --- Professional ---                        [phone], Colonoscopy, flexible; diagnostic, including                         collection of specimen(s) by brushing or washing, when                         performed (separate procedure) Diagnosis Code(s):     --- Professional ---                        Z12.11, Encounter for screening for malignant neoplasm                         of colon CPT copyright 2019 [employer]. All rights reserved. The codes documented in this report are preliminary and upon coder review may  be revised to meet current compliance requirements. Midge Minium MD,  MD 07/11/2020 9:48:45 AM This report has been signed electronically. Number of Addenda: 0 Note Initiated On: 07/11/2020 9:18 AM Scope Withdrawal Time: 0 hours 6 minutes 55 seconds  Total Procedure Duration: 0 hours 11 minutes 9 seconds  Estimated Blood Loss:  Estimated blood loss: none.      [facility]

## 2020-07-11 NOTE — H&P (Signed)
Midge Minium, MD [facility] [address], [address] [place], [place] [address] Phone: [phone] Fax : [phone]  Primary Care Physician:  Duanne Limerick, MD Primary Gastroenterologist:  Dr. Servando Snare  Pre-Procedure History & Physical: HPI:  Caitlin Dean is a 59 y.o. female is here for a screening colonoscopy.   Past Medical History:  Diagnosis Date  . Hypertension   . Pre-diabetes     Past Surgical History:  Procedure Laterality Date  . ABDOMINAL HYSTERECTOMY      Prior to Admission medications   Medication Sig Start Date End Date Taking? Authorizing Provider  Chromium Picolinate (CHROMIUM PICOLATE PO) Take by mouth daily.   Yes [provider]  hydrochlorothiazide (HYDRODIURIL) 25 MG tablet Take 1 tablet (25 mg total) by mouth daily. 06/13/20  Yes Duanne Limerick, MD  Multiple Vitamin (MULTIVITAMIN) tablet Take 1 tablet by mouth daily.   Yes [provider]  Omega-3 Fatty Acids (FISH OIL PO) Take by mouth daily.   Yes [provider]    Allergies as of 07/02/2020 - Review Complete 07/02/2020  Allergen Reaction Noted  . Sulfa antibiotics  12/23/2017  . Sulfa antibiotics Rash 07/05/2012    Family History  Problem Relation Age of Onset  . Hypertension Mother   . Cancer Paternal Grandfather   . Breast cancer Neg Hx     Social History   Socioeconomic History  . Marital status: Single    Spouse name: Not on file  . Number of children: Not on file  . Years of education: Not on file  . Highest education level: Not on file  Occupational History  . Not on file  Tobacco Use  . Smoking status: Never Smoker  . Smokeless tobacco: Never Used  Vaping Use  . Vaping Use: Never used  Substance and Sexual Activity  . Alcohol use: Not Currently  . Drug use: Never  . Sexual activity: Yes  Other Topics Concern  . Not on file  Social History Narrative   ** Merged History Encounter **       Social Determinants of Health   Financial Resource  Strain:   . Difficulty of Paying Living Expenses: Not on file  Food Insecurity:   . Worried About Programme researcher, broadcasting/film/video in the Last Year: Not on file  . Ran Out of Food in the Last Year: Not on file  Transportation Needs:   . Lack of Transportation (Medical): Not on file  . Lack of Transportation (Non-Medical): Not on file  Physical Activity:   . Days of Exercise per Week: Not on file  . Minutes of Exercise per Session: Not on file  Stress:   . Feeling of Stress : Not on file  Social Connections:   . Frequency of Communication with Friends and Family: Not on file  . Frequency of Social Gatherings with Friends and Family: Not on file  . Attends Religious Services: Not on file  . Active Member of Clubs or Organizations: Not on file  . Attends Banker Meetings: Not on file  . Marital Status: Not on file  Intimate Partner Violence:   . Fear of Current or Ex-Partner: Not on file  . Emotionally Abused: Not on file  . Physically Abused: Not on file  . Sexually Abused: Not on file    Review of Systems: See HPI, otherwise negative ROS  Physical Exam: BP 127/77   Pulse 70   Temp 97.7 F (36.5 C) (Temporal)   Ht 4'  11" (1.499 m)   Wt 64.9 kg   SpO2 98%   BMI 28.88 kg/m  General:   Alert,  pleasant and cooperative in NAD Head:  Normocephalic and atraumatic. Neck:  Supple; no masses or thyromegaly. Lungs:  Clear throughout to auscultation.    Heart:  Regular rate and rhythm. Abdomen:  Soft, nontender and nondistended. Normal bowel sounds, without guarding, and without rebound.   Neurologic:  Alert and  oriented x4;  grossly normal neurologically.  Impression/Plan: Caitlin Dean is now here to undergo a screening colonoscopy.  Risks, benefits, and alternatives regarding colonoscopy have been reviewed with the patient.  Questions have been answered.  All parties agreeable.

## 2020-07-12 ENCOUNTER — Encounter: Payer: Self-pay | Admitting: Gastroenterology

## 2020-07-29 ENCOUNTER — Ambulatory Visit (INDEPENDENT_AMBULATORY_CARE_PROVIDER_SITE_OTHER): Payer: No Typology Code available for payment source | Admitting: Family Medicine

## 2020-07-29 ENCOUNTER — Other Ambulatory Visit: Payer: Self-pay

## 2020-07-29 ENCOUNTER — Encounter: Payer: Self-pay | Admitting: Family Medicine

## 2020-07-29 VITALS — BP 120/70 | HR 76 | Ht 59.0 in | Wt 150.0 lb

## 2020-07-29 DIAGNOSIS — R21 Rash and other nonspecific skin eruption: Secondary | ICD-10-CM | POA: Diagnosis not present

## 2020-07-29 NOTE — Patient Instructions (Signed)
Melasma Melasma is a skin condition that causes areas of darker coloring. It usually appears in patches on the cheeks, forehead, upper lip, and neck. These patches can look like a mask. The discolored areas do not itch and are not red or swollen. Melasma is not contagious. This means that it does not spread from person to person. What are the causes? The cause of this condition is not known. However, it can be started by certain triggers, such as:  Being out in the sun.  Allergies to medicines or cosmetics.  Changes in your hormones, such as: ? Taking birth control medicines. ? Taking hormone replacement therapy. ? Being pregnant. What increases the risk? The following factors may make you more likely to develop this condition:  Being a woman. Melasma is less common in men.  Having a family history of melasma.  Having darker skin.  Living in a tropical climate. What are the signs or symptoms? The only symptom of this condition is dark or tan patches on the skin. How is this diagnosed? This condition is diagnosed based on:  A physical exam. Your health care provider will examine the physical appearance of your skin. He or she may use an ultraviolet light, called a Wood lamp, to look more closely at your skin.  Biopsy. A small sample of your skin is taken and examined under a microscope. This is done to make sure your melasma is not caused by another skin condition, such as skin cancer. How is this treated? There is no cure for this condition. However, there are treatments that may lighten the color of the darker patches. Treatment may include:  Medicines, such as bleaching or steroid creams.  Facial or chemical peels.  Laser treatment.  Dermabrasion or microdermabrasion. These procedures use fine instruments to scrape and remove the outer layer of skin in order to grow new, healthy-looking skin. Your melasma may also go away on its own over time. Follow these instructions at  home:  Lifestyle  Avoid overexposure to the sun, especially in tropical areas.  Wear sunscreen with an SPF of 30 or higher every day.  Wear a hat that protects your face from the sun.  Use gentle cosmetics that are meant for sensitive skin.  Do not use wax to remove excess hair in areas where you have or have had melasma. General instructions  Take or apply over-the-counter and prescription medicines only as told by your health care provider.  Keep all follow-up visits as told by your health care provider. This is important. Contact a health care provider if:  You have new symptoms.  Your symptoms get worse.  Your affected skin areas are: ? Bleeding. ? Irritated. Summary  Melasma is a skin condition that causes areas of darker coloring that do not itch and are not red or swollen.  The cause of this condition is not known. However, it can be started by certain triggers such as sun exposure, allergies to medicines or cosmetics, or changes in your hormones.  Risk factors include being a woman, having a family history of melasma, having darker skin, or living in a tropical climate.  There is no cure for this condition. However, there are treatments that may lighten the color of the darker patches. They include medicine, facial or chemical peels, laser treatment, dermabrasion, or microdermabrasion. This information is not intended to replace advice given to you by your health care provider. Make sure you discuss any questions you have with your health care provider.  Document Revised: 11/01/2017 Document Reviewed: 11/01/2017 Elsevier Patient Education  2020 [employer].

## 2020-07-29 NOTE — Progress Notes (Signed)
Date:  07/29/2020   Name:  Caitlin Dean   DOB:  Nov 16, 1960   MRN:  [id number]   Chief Complaint: Rash (brown spots on cheeks x 1 months- doesn't hurt or itch, she just noticed them)  Rash This is a new problem. The current episode started more than 1 month ago. The problem is unchanged. The affected locations include the face. Rash characteristics: pigmented macules. She was exposed to nothing. Pertinent negatives include no anorexia, congestion, cough, diarrhea, eye pain, facial edema, fatigue, fever, joint pain, nail changes, rhinorrhea, shortness of breath, sore throat or vomiting. Past treatments include nothing.    Lab Results  Component Value Date   CREATININE 0.96 06/19/2020   BUN 16 06/19/2020   NA 145 (H) 06/19/2020   K 4.0 06/19/2020   CL 106 06/19/2020   CO2 28 06/19/2020   Lab Results  Component Value Date   CHOL 193 06/19/2020   HDL 71 06/19/2020   LDLCALC 112 (H) 06/19/2020   TRIG 54 06/19/2020   No results found for: TSH Lab Results  Component Value Date   HGBA1C 5.8 (H) 06/19/2020   Lab Results  Component Value Date   WBC 4.0 06/19/2020   HGB 13.3 06/19/2020   HCT 40.1 06/19/2020   MCV 88 06/19/2020   PLT 206 06/19/2020   Lab Results  Component Value Date   ALT 16 06/19/2020   AST 22 06/19/2020   ALKPHOS 62 06/19/2020   BILITOT 0.3 06/19/2020     Review of Systems  Constitutional: Negative.  Negative for chills, fatigue, fever and unexpected weight change.  HENT: Negative for congestion, ear discharge, ear pain, rhinorrhea, sinus pressure, sneezing and sore throat.   Eyes: Negative for photophobia, pain, discharge, redness and itching.  Respiratory: Negative for cough, shortness of breath, wheezing and stridor.   Gastrointestinal: Negative for abdominal pain, anorexia, blood in stool, constipation, diarrhea, nausea and vomiting.  Endocrine: Negative for cold intolerance, heat intolerance, polydipsia, polyphagia and polyuria.    Genitourinary: Negative for dysuria, flank pain, frequency, hematuria, menstrual problem, pelvic pain, urgency, vaginal bleeding and vaginal discharge.  Musculoskeletal: Negative for arthralgias, back pain, joint pain and myalgias.  Skin: Positive for rash. Negative for nail changes.  Allergic/Immunologic: Negative for environmental allergies and food allergies.  Neurological: Negative for dizziness, weakness, light-headedness, numbness and headaches.  Hematological: Negative for adenopathy. Does not bruise/bleed easily.  Psychiatric/Behavioral: Negative for dysphoric mood. The patient is not nervous/anxious.     Patient Active Problem List   Diagnosis Date Noted  . Special screening for malignant neoplasms, colon   . Essential hypertension 07/17/2015  . Routine history and physical examination of adult 07/02/2015  . Prediabetes 04/12/2012  . Allergic rhinitis 03/19/2012  . Middle ear effusion 03/19/2012    Allergies  Allergen Reactions  . Sulfa Antibiotics Rash    Past Surgical History:  Procedure Laterality Date  . ABDOMINAL HYSTERECTOMY    . COLONOSCOPY WITH PROPOFOL N/A 07/11/2020   Procedure: COLONOSCOPY WITH PROPOFOL;  Surgeon: Midge Minium, MD;  Location: [facility];  Service: Endoscopy;  Laterality: N/A;  priority 4    Social History   Tobacco Use  . Smoking status: Never Smoker  . Smokeless tobacco: Never Used  Vaping Use  . Vaping Use: Never used  Substance Use Topics  . Alcohol use: Not Currently  . Drug use: Never     Medication list has been reviewed and updated.  Current Meds  Medication Sig  . Chromium Picolinate (  CHROMIUM PICOLATE PO) Take by mouth daily.  . hydrochlorothiazide (HYDRODIURIL) 25 MG tablet Take 1 tablet (25 mg total) by mouth daily.  . Multiple Vitamin (MULTIVITAMIN) tablet Take 1 tablet by mouth daily.  . Omega-3 Fatty Acids (FISH OIL PO) Take by mouth daily.    PHQ 2/9 Scores 06/13/2020 01/11/2020 08/02/2019 06/13/2019  PHQ  - 2 Score 0 0 0 0  PHQ- 9 Score 0 0 0 0    GAD 7 : Generalized Anxiety Score 06/13/2020 01/11/2020  Nervous, Anxious, on Edge 0 0  Control/stop worrying 0 0  Worry too much - different things 0 0  Trouble relaxing 0 0  Restless 0 0  Easily annoyed or irritable 0 0  Afraid - awful might happen 0 0  Total GAD 7 Score 0 0    BP Readings from Last 3 Encounters:  07/29/20 120/70  07/11/20 137/88  06/19/20 128/78    Physical Exam Vitals and nursing note reviewed.  Constitutional:      Appearance: She is well-developed.  HENT:     Head: Normocephalic.     Right Ear: External ear normal.     Left Ear: External ear normal.  Eyes:     General: Lids are everted, no foreign bodies appreciated. No scleral icterus.       Left eye: No foreign body or hordeolum.     Conjunctiva/sclera: Conjunctivae normal.     Right eye: Right conjunctiva is not injected.     Left eye: Left conjunctiva is not injected.     Pupils: Pupils are equal, round, and reactive to light.  Neck:     Thyroid: No thyromegaly.     Vascular: No JVD.     Trachea: No tracheal deviation.  Cardiovascular:     Rate and Rhythm: Normal rate and regular rhythm.     Heart sounds: Normal heart sounds. No murmur heard.  No friction rub. No gallop.   Pulmonary:     Effort: Pulmonary effort is normal. No respiratory distress.     Breath sounds: Normal breath sounds. No wheezing or rales.  Abdominal:     General: Bowel sounds are normal.     Palpations: Abdomen is soft. There is no mass.     Tenderness: There is no abdominal tenderness. There is no guarding or rebound.  Musculoskeletal:        General: No tenderness. Normal range of motion.     Cervical back: Normal range of motion and neck supple.  Lymphadenopathy:     Cervical: No cervical adenopathy.  Skin:    General: Skin is warm.     Findings: No rash.  Neurological:     Mental Status: She is alert and oriented to person, place, and time.     Cranial Nerves: No  cranial nerve deficit.     Deep Tendon Reflexes: Reflexes normal.  Psychiatric:        Mood and Affect: Mood is not anxious or depressed.     Wt Readings from Last 3 Encounters:  07/29/20 150 lb (68 kg)  07/11/20 143 lb (64.9 kg)  06/19/20 147 lb (66.7 kg)    BP 120/70   Pulse 76   Ht 4\' 11"  (1.499 m)   Wt 150 lb (68 kg)   BMI 30.30 kg/m   Assessment and Plan: 1. Facial rash New onset. Persistent. Patient notes multiple areas of pigmented macular suggests melasma to me however patient has concerns about rosacea although I do not appreciate a significant  amount of erythema or induration. We will refer to dermatology for evaluation and treatment. - Ambulatory referral to Dermatology

## 2021-02-12 DIAGNOSIS — Z90710 Acquired absence of both cervix and uterus: Secondary | ICD-10-CM | POA: Insufficient documentation

## 2021-07-13 ENCOUNTER — Other Ambulatory Visit: Payer: Self-pay | Admitting: Family Medicine

## 2021-07-13 DIAGNOSIS — I10 Essential (primary) hypertension: Secondary | ICD-10-CM

## 2021-07-13 NOTE — Telephone Encounter (Signed)
dc'd 06/13/20 Dose changed Dr Yetta Barre

## 2021-07-23 ENCOUNTER — Other Ambulatory Visit: Payer: Self-pay

## 2021-07-23 ENCOUNTER — Ambulatory Visit (INDEPENDENT_AMBULATORY_CARE_PROVIDER_SITE_OTHER): Payer: No Typology Code available for payment source | Admitting: Family Medicine

## 2021-07-23 ENCOUNTER — Encounter: Payer: Self-pay | Admitting: Family Medicine

## 2021-07-23 VITALS — BP 130/82 | HR 80 | Ht 59.0 in | Wt 156.0 lb

## 2021-07-23 DIAGNOSIS — I10 Essential (primary) hypertension: Secondary | ICD-10-CM | POA: Diagnosis not present

## 2021-07-23 DIAGNOSIS — Z6831 Body mass index (BMI) 31.0-31.9, adult: Secondary | ICD-10-CM | POA: Diagnosis not present

## 2021-07-23 DIAGNOSIS — G4739 Other sleep apnea: Secondary | ICD-10-CM

## 2021-07-23 NOTE — Progress Notes (Signed)
Date:  07/23/2021   Name:  Caitlin Dean   DOB:  11-28-1960   MRN:  [id number]   Chief Complaint: Snoring (Pt's woke herself up snoring and husband is not happy with her snoring either. She is having daytime somulence, fatigue during daytime, easy to go to sleep as a passenger)  HPI  Lab Results  Component Value Date   CREATININE 0.96 06/19/2020   BUN 16 06/19/2020   NA 145 (H) 06/19/2020   K 4.0 06/19/2020   CL 106 06/19/2020   CO2 28 06/19/2020   Lab Results  Component Value Date   CHOL 193 06/19/2020   HDL 71 06/19/2020   LDLCALC 112 (H) 06/19/2020   TRIG 54 06/19/2020   No results found for: TSH Lab Results  Component Value Date   HGBA1C 5.8 (H) 06/19/2020   Lab Results  Component Value Date   WBC 4.0 06/19/2020   HGB 13.3 06/19/2020   HCT 40.1 06/19/2020   MCV 88 06/19/2020   PLT 206 06/19/2020   Lab Results  Component Value Date   ALT 16 06/19/2020   AST 22 06/19/2020   ALKPHOS 62 06/19/2020   BILITOT 0.3 06/19/2020     Review of Systems  Patient Active Problem List   Diagnosis Date Noted   Status post total hysterectomy 02/12/2021   Special screening for malignant neoplasms, colon    Essential hypertension 07/17/2015   Routine history and physical examination of adult 07/02/2015   Prediabetes 04/12/2012   Allergic rhinitis 03/19/2012   Middle ear effusion 03/19/2012    Allergies  Allergen Reactions   Sulfa Antibiotics Rash    Past Surgical History:  Procedure Laterality Date   ABDOMINAL HYSTERECTOMY     COLONOSCOPY WITH PROPOFOL N/A 07/11/2020   Procedure: COLONOSCOPY WITH PROPOFOL;  Surgeon: Midge Minium, MD;  Location: [facility];  Service: Endoscopy;  Laterality: N/A;  priority 4    Social History   Tobacco Use   Smoking status: Never   Smokeless tobacco: Never  Vaping Use   Vaping Use: Never used  Substance Use Topics   Alcohol use: Not Currently   Drug use: Never     Medication list has been reviewed and  updated.  Current Meds  Medication Sig   hydrochlorothiazide (HYDRODIURIL) 25 MG tablet Take 1 tablet (25 mg total) by mouth daily.   Multiple Vitamin (MULTIVITAMIN) tablet Take 1 tablet by mouth daily.   Omega-3 Fatty Acids (FISH OIL PO) Take by mouth daily.    PHQ 2/9 Scores 07/23/2021 06/13/2020 01/11/2020 08/02/2019  PHQ - 2 Score 0 0 0 0  PHQ- 9 Score 0 0 0 0    GAD 7 : Generalized Anxiety Score 07/23/2021 06/13/2020 01/11/2020  Nervous, Anxious, on Edge 0 0 0  Control/stop worrying 0 0 0  Worry too much - different things 0 0 0  Trouble relaxing 0 0 0  Restless 0 0 0  Easily annoyed or irritable 0 0 0  Afraid - awful might happen 0 0 0  Total GAD 7 Score 0 0 0    BP Readings from Last 3 Encounters:  07/23/21 130/82  07/29/20 120/70  07/11/20 137/88    Physical Exam  Wt Readings from Last 3 Encounters:  07/23/21 156 lb (70.8 kg)  07/29/20 150 lb (68 kg)  07/11/20 143 lb (64.9 kg)    BP 130/82   Pulse 80   Ht 4\' 11"  (1.499 m)   Wt 156 lb (70.8 kg)  BMI 31.51 kg/m   Assessment and Plan:

## 2021-07-23 NOTE — Patient Instructions (Signed)

## 2021-07-23 NOTE — Progress Notes (Signed)
Date:  07/23/2021   Name:  Caitlin Dean   DOB:  1961-07-21   MRN:  [id number]   Chief Complaint: No chief complaint on file.  Patient is a 60 year old female who presents for a comprehensive physical exam. The patient reports the following problems: snoring/somnolence. Health maintenance has been reviewed up to date.     Lab Results  Component Value Date   CREATININE 0.96 06/19/2020   BUN 16 06/19/2020   NA 145 (H) 06/19/2020   K 4.0 06/19/2020   CL 106 06/19/2020   CO2 28 06/19/2020   Lab Results  Component Value Date   CHOL 193 06/19/2020   HDL 71 06/19/2020   LDLCALC 112 (H) 06/19/2020   TRIG 54 06/19/2020   No results found for: TSH Lab Results  Component Value Date   HGBA1C 5.8 (H) 06/19/2020   Lab Results  Component Value Date   WBC 4.0 06/19/2020   HGB 13.3 06/19/2020   HCT 40.1 06/19/2020   MCV 88 06/19/2020   PLT 206 06/19/2020   Lab Results  Component Value Date   ALT 16 06/19/2020   AST 22 06/19/2020   ALKPHOS 62 06/19/2020   BILITOT 0.3 06/19/2020     Review of Systems  Constitutional:  Negative for chills and fever.  HENT:  Negative for drooling, ear discharge, ear pain and sore throat.   Respiratory:  Negative for cough, shortness of breath and wheezing.   Cardiovascular:  Negative for chest pain, palpitations and leg swelling.  Gastrointestinal:  Negative for abdominal pain, blood in stool, constipation, diarrhea and nausea.  Endocrine: Negative for polydipsia.  Genitourinary:  Negative for dysuria, frequency, hematuria and urgency.  Musculoskeletal:  Negative for back pain, myalgias and neck pain.  Skin:  Negative for rash.  Allergic/Immunologic: Negative for environmental allergies.  Neurological:  Negative for dizziness and headaches.  Hematological:  Does not bruise/bleed easily.  Psychiatric/Behavioral:  Negative for suicidal ideas. The patient is not nervous/anxious.    Patient Active Problem List   Diagnosis Date Noted    Status post total hysterectomy 02/12/2021   Special screening for malignant neoplasms, colon    Essential hypertension 07/17/2015   Routine history and physical examination of adult 07/02/2015   Prediabetes 04/12/2012   Allergic rhinitis 03/19/2012   Middle ear effusion 03/19/2012    Allergies  Allergen Reactions   Sulfa Antibiotics Rash    Past Surgical History:  Procedure Laterality Date   ABDOMINAL HYSTERECTOMY     COLONOSCOPY WITH PROPOFOL N/A 07/11/2020   Procedure: COLONOSCOPY WITH PROPOFOL;  Surgeon: Midge Minium, MD;  Location: [facility];  Service: Endoscopy;  Laterality: N/A;  priority 4    Social History   Tobacco Use   Smoking status: Never   Smokeless tobacco: Never  Vaping Use   Vaping Use: Never used  Substance Use Topics   Alcohol use: Not Currently   Drug use: Never     Medication list has been reviewed and updated.  No outpatient medications have been marked as taking for the 07/23/21 encounter (Appointment) with Duanne Limerick, MD.    [facility] 2/9 Scores 06/13/2020 01/11/2020 08/02/2019 06/13/2019  PHQ - 2 Score 0 0 0 0  PHQ- 9 Score 0 0 0 0    GAD 7 : Generalized Anxiety Score 06/13/2020 01/11/2020  Nervous, Anxious, on Edge 0 0  Control/stop worrying 0 0  Worry too much - different things 0 0  Trouble relaxing 0 0  Restless 0 0  Easily annoyed or irritable 0 0  Afraid - awful might happen 0 0  Total GAD 7 Score 0 0    BP Readings from Last 3 Encounters:  07/29/20 120/70  07/11/20 137/88  06/19/20 128/78    Physical Exam Vitals and nursing note reviewed.  Constitutional:      Appearance: She is well-developed.  HENT:     Head: Normocephalic.     Right Ear: External ear normal.     Left Ear: External ear normal.  Eyes:     General: Lids are everted, no foreign bodies appreciated. No scleral icterus.       Left eye: No foreign body or hordeolum.     Conjunctiva/sclera: Conjunctivae normal.     Right eye: Right conjunctiva is  not injected.     Left eye: Left conjunctiva is not injected.     Pupils: Pupils are equal, round, and reactive to light.  Neck:     Thyroid: No thyromegaly.     Vascular: No JVD.     Trachea: No tracheal deviation.  Cardiovascular:     Rate and Rhythm: Normal rate and regular rhythm.     Heart sounds: Normal heart sounds. No murmur heard.   No friction rub. No gallop.  Pulmonary:     Effort: Pulmonary effort is normal. No respiratory distress.     Breath sounds: Normal breath sounds. No wheezing or rales.  Abdominal:     General: Bowel sounds are normal.     Palpations: Abdomen is soft. There is no mass.     Tenderness: There is no abdominal tenderness. There is no guarding or rebound.  Musculoskeletal:        General: No tenderness. Normal range of motion.     Cervical back: Normal range of motion and neck supple.  Lymphadenopathy:     Cervical: No cervical adenopathy.  Skin:    General: Skin is warm.     Findings: No rash.  Neurological:     Mental Status: She is alert and oriented to person, place, and time.     Cranial Nerves: No cranial nerve deficit.     Deep Tendon Reflexes: Reflexes normal.  Psychiatric:        Mood and Affect: Mood is not anxious or depressed.    Wt Readings from Last 3 Encounters:  07/29/20 150 lb (68 kg)  07/11/20 143 lb (64.9 kg)  06/19/20 147 lb (66.7 kg)    There were no vitals taken for this visit.  Assessment and Plan:  1. Sleep apnea-like behavior Chronic.  Persistent.  Gradually worsening.  Patient has observed behavior consistent with sleep apnea of an obstructive nature.  Patient has very loud snoring with periods of apnea noted.  Epworth scale was done in there is somnolence during certain periods with a score of 5.  We will refer to ear nose and throat. - Ambulatory referral to ENT  2. BMI 31.0-31.9,adult Patient with weight gain over the past several months.  Currently BMI is now over 31 in the obesity range.  Weight loss diet  has been given and patient has been instructed to lose weight as that this is part of the treatment for obstructive sleep apnea.  3. Essential hypertension Patient has history of hypertension and is currently 130/82.  Blood pressure medicine has been called until she can return for her physical.

## 2021-07-25 ENCOUNTER — Telehealth: Payer: Self-pay | Admitting: Family Medicine

## 2021-07-25 ENCOUNTER — Other Ambulatory Visit: Payer: Self-pay

## 2021-07-25 DIAGNOSIS — I10 Essential (primary) hypertension: Secondary | ICD-10-CM

## 2021-07-25 MED ORDER — HYDROCHLOROTHIAZIDE 25 MG PO TABS: 25.0000 mg | ORAL_TABLET | Freq: Every day | ORAL | 1 refills | Status: DC

## 2021-07-25 NOTE — Telephone Encounter (Signed)
Medication: hydrochlorothiazide (HYDRODIURIL) 25 MG tablet [[id number]]   Has the patient contacted their pharmacy? YES - Medication was not called in (Agent: If no, request that the patient contact the pharmacy for the refill.) (Agent: If yes, when and what did the pharmacy advise?)  Preferred Pharmacy (with phone number or street name): [facility] [address] [place] - [address] [place] [address] Phone: [phone] Fax: [phone] Hours: Not open 24 hours   Has the patient been seen for an appointment in the last year OR does the patient have an upcoming appointment? Yes discussed at 07/23/21 appt  Agent: Please be advised that RX refills may take up to 3 business days. We ask that you follow-up with your pharmacy.

## 2021-07-25 NOTE — Telephone Encounter (Signed)
MEDICATION REFILL 

## 2021-07-25 NOTE — Progress Notes (Signed)
Sent in HCTZ

## 2021-08-12 ENCOUNTER — Other Ambulatory Visit: Payer: Self-pay | Admitting: Family Medicine

## 2021-08-12 DIAGNOSIS — Z1231 Encounter for screening mammogram for malignant neoplasm of breast: Secondary | ICD-10-CM

## 2021-08-19 ENCOUNTER — Other Ambulatory Visit: Payer: Self-pay

## 2021-08-19 ENCOUNTER — Ambulatory Visit
Admission: RE | Admit: 2021-08-19 | Discharge: 2021-08-19 | Disposition: A | Discharge status: Home / Self Care | Payer: No Typology Code available for payment source | Source: Ambulatory Visit | Attending: Family Medicine | Admitting: Family Medicine

## 2021-08-19 DIAGNOSIS — Z1231 Encounter for screening mammogram for malignant neoplasm of breast: Secondary | ICD-10-CM | POA: Insufficient documentation

## 2021-08-22 ENCOUNTER — Other Ambulatory Visit (HOSPITAL_BASED_OUTPATIENT_CLINIC_OR_DEPARTMENT_OTHER): Payer: Self-pay

## 2021-08-25 ENCOUNTER — Other Ambulatory Visit (HOSPITAL_BASED_OUTPATIENT_CLINIC_OR_DEPARTMENT_OTHER): Payer: Self-pay

## 2021-08-25 ENCOUNTER — Ambulatory Visit: Payer: No Typology Code available for payment source | Attending: Internal Medicine

## 2021-08-25 DIAGNOSIS — Z23 Encounter for immunization: Secondary | ICD-10-CM

## 2021-08-25 MED ORDER — PFIZER COVID-19 VAC BIVALENT 30 MCG/0.3ML IM SUSP
INTRAMUSCULAR | 0 refills | Status: DC
  Filled 2021-08-25: qty 0.3, 1d supply, fill #0

## 2021-08-25 NOTE — Progress Notes (Signed)
   Covid-19 Vaccination Clinic  Name:  RUDY DOMEK    MRN: [id number] DOB: [birth year]  08/25/2021  Ms. Bakula was observed post Covid-19 immunization for 15 minutes without incident. She was provided with Vaccine Information Sheet and instruction to access the V-Safe system.   Ms. Kirchoff was instructed to call 911 with any severe reactions post vaccine: Difficulty breathing  Swelling of face and throat  A fast heartbeat  A bad rash all over body  Dizziness and weakness   Immunizations Administered     Name Date Dose VIS Date Route   Pfizer Covid-19 Vaccine Bivalent Booster 08/25/2021  2:09 PM 0.3 mL 07/02/2021 Intramuscular   Manufacturer: [employer], [employer]   Lot: [id number]   NDC: [id number]

## 2021-08-26 ENCOUNTER — Ambulatory Visit (INDEPENDENT_AMBULATORY_CARE_PROVIDER_SITE_OTHER): Payer: No Typology Code available for payment source | Admitting: Family Medicine

## 2021-08-26 ENCOUNTER — Other Ambulatory Visit: Payer: Self-pay

## 2021-08-26 ENCOUNTER — Encounter: Payer: Self-pay | Admitting: Family Medicine

## 2021-08-26 ENCOUNTER — Other Ambulatory Visit
Admission: RE | Admit: 2021-08-26 | Discharge: 2021-08-26 | Disposition: A | Discharge status: Home / Self Care | Payer: No Typology Code available for payment source | Attending: Family Medicine | Admitting: Family Medicine

## 2021-08-26 VITALS — BP 120/60 | HR 74 | Ht 59.0 in | Wt 154.0 lb

## 2021-08-26 DIAGNOSIS — R7303 Prediabetes: Secondary | ICD-10-CM

## 2021-08-26 DIAGNOSIS — E7801 Familial hypercholesterolemia: Secondary | ICD-10-CM | POA: Insufficient documentation

## 2021-08-26 DIAGNOSIS — Z0000 Encounter for general adult medical examination without abnormal findings: Secondary | ICD-10-CM | POA: Diagnosis not present

## 2021-08-26 DIAGNOSIS — I10 Essential (primary) hypertension: Secondary | ICD-10-CM

## 2021-08-26 LAB — LIPID PANEL
Cholesterol: 195 mg/dL (ref 0–200)
HDL: 63 mg/dL (ref >40)
LDL Cholesterol: 123 mg/dL — ABNORMAL HIGH (ref 0–99)
Total CHOL/HDL Ratio: 3.1 RATIO
Triglycerides: 44 mg/dL (ref <150)
VLDL: 9 mg/dL (ref 0–40)

## 2021-08-26 LAB — RENAL FUNCTION PANEL
Albumin: 4.3 g/dL (ref 3.5–5.0)
Anion gap: 11 (ref 5–15)
BUN: 20 mg/dL (ref 6–20)
CO2: 30 mmol/L (ref 22–32)
Calcium: 9.4 mg/dL (ref 8.9–10.3)
Chloride: 97 mmol/L — ABNORMAL LOW (ref 98–111)
Creatinine, Ser: 1.03 mg/dL — ABNORMAL HIGH (ref 0.44–1.00)
GFR, Estimated: >60 mL/min (ref >60)
Glucose, Bld: 90 mg/dL (ref 70–99)
Phosphorus: 3.7 mg/dL (ref 2.5–4.6)
Potassium: 3.4 mmol/L — ABNORMAL LOW (ref 3.5–5.1)
Sodium: 138 mmol/L (ref 135–145)

## 2021-08-26 LAB — HEMOGLOBIN A1C
Hgb A1c MFr Bld: 5.9 % — ABNORMAL HIGH (ref 4.8–5.6)
Mean Plasma Glucose: 122.63 mg/dL

## 2021-08-26 NOTE — Progress Notes (Signed)
Date:  08/26/2021   Name:  Caitlin Dean   DOB:  09-Sep-1961   MRN:  [id number]   Chief Complaint: Annual Exam and Prediabetes (Needs A1C checked)  Patient is a 60 year old female who presents for a comprehensive physical exam. The patient reports the following problems: prediabetes. Health maintenance has been reviewed up to date.     Lab Results  Component Value Date   CREATININE 0.96 06/19/2020   BUN 16 06/19/2020   NA 145 (H) 06/19/2020   K 4.0 06/19/2020   CL 106 06/19/2020   CO2 28 06/19/2020   Lab Results  Component Value Date   CHOL 193 06/19/2020   HDL 71 06/19/2020   LDLCALC 112 (H) 06/19/2020   TRIG 54 06/19/2020   No results found for: TSH Lab Results  Component Value Date   HGBA1C 5.8 (H) 06/19/2020   Lab Results  Component Value Date   WBC 4.0 06/19/2020   HGB 13.3 06/19/2020   HCT 40.1 06/19/2020   MCV 88 06/19/2020   PLT 206 06/19/2020   Lab Results  Component Value Date   ALT 16 06/19/2020   AST 22 06/19/2020   ALKPHOS 62 06/19/2020   BILITOT 0.3 06/19/2020     Review of Systems  Patient Active Problem List   Diagnosis Date Noted   Status post total hysterectomy 02/12/2021   Special screening for malignant neoplasms, colon    Essential hypertension 07/17/2015   Routine history and physical examination of adult 07/02/2015   Prediabetes 04/12/2012   Allergic rhinitis 03/19/2012   Middle ear effusion 03/19/2012    Allergies  Allergen Reactions   Sulfa Antibiotics Rash    Past Surgical History:  Procedure Laterality Date   ABDOMINAL HYSTERECTOMY     COLONOSCOPY WITH PROPOFOL N/A 07/11/2020   Procedure: COLONOSCOPY WITH PROPOFOL;  Surgeon: Midge Minium, MD;  Location: [facility];  Service: Endoscopy;  Laterality: N/A;  priority 4    Social History   Tobacco Use   Smoking status: Never   Smokeless tobacco: Never  Vaping Use   Vaping Use: Never used  Substance Use Topics   Alcohol use: Not Currently   Drug  use: Never     Medication list has been reviewed and updated.  Current Meds  Medication Sig   hydrochlorothiazide (HYDRODIURIL) 25 MG tablet Take 1 tablet (25 mg total) by mouth daily.   Multiple Vitamin (MULTIVITAMIN) tablet Take 1 tablet by mouth daily.   Omega-3 Fatty Acids (FISH OIL PO) Take by mouth daily.    PHQ 2/9 Scores 07/23/2021 06/13/2020 01/11/2020 08/02/2019  PHQ - 2 Score 0 0 0 0  PHQ- 9 Score 0 0 0 0    GAD 7 : Generalized Anxiety Score 07/23/2021 06/13/2020 01/11/2020  Nervous, Anxious, on Edge 0 0 0  Control/stop worrying 0 0 0  Worry too much - different things 0 0 0  Trouble relaxing 0 0 0  Restless 0 0 0  Easily annoyed or irritable 0 0 0  Afraid - awful might happen 0 0 0  Total GAD 7 Score 0 0 0    BP Readings from Last 3 Encounters:  08/26/21 120/60  07/23/21 130/82  07/29/20 120/70    Physical Exam Vitals and nursing note reviewed.  Constitutional:      Appearance: Normal appearance. She is well-groomed and overweight.  HENT:     Head: Normocephalic.     Jaw: There is normal jaw occlusion.  Right Ear: Hearing, tympanic membrane, ear canal and external ear normal.     Left Ear: Hearing, tympanic membrane, ear canal and external ear normal.     Mouth/Throat:     Lips: Pink.     Mouth: Mucous membranes are moist. No oral lesions.     Dentition: Normal dentition.     Tongue: No lesions.     Palate: No mass.     Pharynx: Oropharynx is clear. Uvula midline. No posterior oropharyngeal erythema.  Eyes:     General: Lids are normal. Vision grossly intact. Gaze aligned appropriately.     Extraocular Movements: Extraocular movements intact.     Funduscopic exam:    Right eye: Red reflex present.        Left eye: Red reflex present. Neck:     Thyroid: No thyroid mass, thyromegaly or thyroid tenderness.  Cardiovascular:     Chest Wall: PMI is not displaced.     Pulses: Normal pulses.          Carotid pulses are 2+ on the right side and 2+ on the  left side.      Radial pulses are 2+ on the right side and 2+ on the left side.       Femoral pulses are 2+ on the right side and 2+ on the left side.      Dorsalis pedis pulses are 2+ on the right side and 2+ on the left side.       Posterior tibial pulses are 2+ on the right side and 2+ on the left side.     Heart sounds: S1 normal and S2 normal. Heart sounds not distant. No murmur heard. No systolic murmur is present.  No diastolic murmur is present.    No S3 or S4 sounds.  Pulmonary:     Effort: Pulmonary effort is normal.     Breath sounds: Normal breath sounds. No decreased air movement. No decreased breath sounds, wheezing, rhonchi or rales.  Abdominal:     General: Bowel sounds are normal.     Palpations: Abdomen is soft. There is no hepatomegaly or splenomegaly.     Hernia: There is no hernia in the umbilical area, ventral area, left inguinal area or right inguinal area.  Genitourinary:    Rectum: Guaiac result negative. Internal hemorrhoid present. No mass.  Musculoskeletal:     Cervical back: Full passive range of motion without pain, normal range of motion and neck supple.     Right lower leg: No edema.     Left lower leg: No edema.  Lymphadenopathy:     Head:     Right side of head: No submental or submandibular adenopathy.     Left side of head: No submental or submandibular adenopathy.     Cervical: No cervical adenopathy.     Right cervical: No superficial, deep or posterior cervical adenopathy.    Left cervical: No superficial, deep or posterior cervical adenopathy.     Upper Body:     Right upper body: No supraclavicular adenopathy.     Left upper body: No supraclavicular adenopathy.  Skin:    Capillary Refill: Capillary refill takes less than 2 seconds.  Neurological:     Cranial Nerves: Cranial nerves 2-12 are intact.     Sensory: Sensation is intact.     Motor: Motor function is intact.     Coordination: Romberg sign negative.     Deep Tendon Reflexes:  Reflexes are normal and symmetric.  Psychiatric:  Behavior: Behavior is cooperative.    Wt Readings from Last 3 Encounters:  08/26/21 154 lb (69.9 kg)  07/23/21 156 lb (70.8 kg)  07/29/20 150 lb (68 kg)    BP 120/60   Pulse 74   Ht 4\' 11"  (1.499 m)   Wt 154 lb (69.9 kg)   BMI 31.10 kg/m   Assessment and Plan: Patient is a 60year old female who presents for a comprehensive physical exam. The patient reports the following problems: Prediabetes and hypercholesterolemia. Health maintenance has been reviewed up-to-date.  Patient's chart was reviewed for previous encounters most recent labs most recent imaging in Care Everywhere. 1. Annual physical exam Immunizations are reviewed and recommendations provided.   Age appropriate screening tests are discussed. Counseling given for risk factor reduction interventions.  No subjective/objective concerns noted during history and review of systems and physical exam. - Lipid Panel With LDL/HDL Ratio - Renal Function Panel  2. Prediabetes Chronic.  Controlled.  Will check A1c for current status of control. - HgB A1c  3. Familial hypercholesterolemia Chronic.  Controlled.  Stable.  Will check lipid panel for current level of control. - Lipid Panel With LDL/HDL Ratio  4. Essential hypertension Chronic.  Controlled.  Stable.  Blood pressure is 120/60.  There is no orthostatic dizziness.  We will continue continue with checking a renal function panel for electrolytes and GFR. - Renal Function Panel

## 2021-08-27 ENCOUNTER — Other Ambulatory Visit: Payer: Self-pay

## 2021-08-27 DIAGNOSIS — E876 Hypokalemia: Secondary | ICD-10-CM

## 2021-08-27 MED ORDER — POTASSIUM CHLORIDE CRYS ER 10 MEQ PO TBCR: 10.0000 meq | EXTENDED_RELEASE_TABLET | Freq: Once | ORAL | 1 refills | Status: DC

## 2021-08-27 NOTE — Progress Notes (Signed)
Sent in Potassium to [facility]

## 2021-11-06 ENCOUNTER — Other Ambulatory Visit: Payer: Self-pay | Admitting: Family Medicine

## 2021-11-06 DIAGNOSIS — I10 Essential (primary) hypertension: Secondary | ICD-10-CM

## 2021-11-07 NOTE — Telephone Encounter (Signed)
Requested Prescriptions  Pending Prescriptions Disp Refills   hydrochlorothiazide (HYDRODIURIL) 25 MG tablet [Pharmacy Med Name: hydroCHLOROthiazide 25 MG Oral Tablet] 30 tablet 2    Sig: TAKE 1 TABLET BY MOUTH ONCE DAILY (NEEDS  REFILLS  APPOINTMENT  BEFORE  THE  END  OF  THE  YEAR)     Cardiovascular: Diuretics - Thiazide Failed - 11/06/2021  5:41 PM      Failed - Cr in normal range and within 360 days    Creatinine, Ser  Date Value Ref Range Status  08/26/2021 1.03 (H) 0.44 - 1.00 mg/dL Final         Failed - K in normal range and within 360 days    Potassium  Date Value Ref Range Status  08/26/2021 3.4 (L) 3.5 - 5.1 mmol/L Final         Passed - Ca in normal range and within 360 days    Calcium  Date Value Ref Range Status  08/26/2021 9.4 8.9 - 10.3 mg/dL Final         Passed - Na in normal range and within 360 days    Sodium  Date Value Ref Range Status  08/26/2021 138 135 - 145 mmol/L Final  06/19/2020 145 (H) 134 - 144 mmol/L Final         Passed - Last BP in normal range    BP Readings from Last 1 Encounters:  08/26/21 120/60         Passed - Valid encounter within last 6 months    Recent Outpatient Visits          2 months ago Annual physical exam   [facility] Duanne Limerick, MD   3 months ago Sleep apnea-like behavior   [facility] Duanne Limerick, MD   1 year ago Facial rash   [facility] Duanne Limerick, MD   1 year ago Annual physical exam   [facility] Duanne Limerick, MD   1 year ago Essential hypertension   [facility] Duanne Limerick, MD

## 2021-11-12 ENCOUNTER — Other Ambulatory Visit (HOSPITAL_BASED_OUTPATIENT_CLINIC_OR_DEPARTMENT_OTHER): Payer: Self-pay

## 2021-11-12 MED ORDER — ZOSTER VAC RECOMB ADJUVANTED 50 MCG/0.5ML IM SUSR
INTRAMUSCULAR | 1 refills | Status: DC
  Filled 2021-11-12: qty 0.5, 1d supply, fill #0
  Filled 2022-01-14: qty 0.5, 1d supply, fill #1

## 2021-11-13 ENCOUNTER — Other Ambulatory Visit (HOSPITAL_BASED_OUTPATIENT_CLINIC_OR_DEPARTMENT_OTHER): Payer: Self-pay

## 2022-01-14 ENCOUNTER — Other Ambulatory Visit (HOSPITAL_BASED_OUTPATIENT_CLINIC_OR_DEPARTMENT_OTHER): Payer: Self-pay

## 2022-01-15 ENCOUNTER — Other Ambulatory Visit (HOSPITAL_BASED_OUTPATIENT_CLINIC_OR_DEPARTMENT_OTHER): Payer: Self-pay

## 2022-02-18 ENCOUNTER — Other Ambulatory Visit: Payer: Self-pay | Admitting: Family Medicine

## 2022-02-18 DIAGNOSIS — I10 Essential (primary) hypertension: Secondary | ICD-10-CM

## 2022-02-19 NOTE — Telephone Encounter (Signed)
Requested Prescriptions  ?Pending Prescriptions Disp Refills  ?? hydrochlorothiazide (HYDRODIURIL) 25 MG tablet [Pharmacy Med Name: hydroCHLOROthiazide 25 MG Oral Tablet] 90 tablet 0  ?  Sig: TAKE 1 TABLET BY MOUTH ONCE DAILY NEEDS  REFILL  APPOINTMENT  BEFORE  END  OF  THE  YEAR  ?  ? Cardiovascular: Diuretics - Thiazide Failed - 02/18/2022  6:22 PM  ?  ?  Failed - Cr in normal range and within 180 days  ?  Creatinine, Ser  ?Date Value Ref Range Status  ?08/26/2021 1.03 (H) 0.44 - 1.00 mg/dL Final  ?   ?  ?  Failed - K in normal range and within 180 days  ?  Potassium  ?Date Value Ref Range Status  ?08/26/2021 3.4 (L) 3.5 - 5.1 mmol/L Final  ?   ?  ?  Passed - Na in normal range and within 180 days  ?  Sodium  ?Date Value Ref Range Status  ?08/26/2021 138 135 - 145 mmol/L Final  ?06/19/2020 145 (H) 134 - 144 mmol/L Final  ?   ?  ?  Passed - Last BP in normal range  ?  BP Readings from Last 1 Encounters:  ?08/26/21 120/60  ?   ?  ?  Passed - Valid encounter within last 6 months  ?  Recent Outpatient Visits   ?      ? 5 months ago Annual physical exam  ? [facility] Duanne Limerick, MD  ? 7 months ago Sleep apnea-like behavior  ? [facility] Duanne Limerick, MD  ? 1 year ago Facial rash  ? [facility] Duanne Limerick, MD  ? 1 year ago Annual physical exam  ? [facility] Duanne Limerick, MD  ? 1 year ago Essential hypertension  ? [facility] Duanne Limerick, MD  ?  ?  ? ?  ?  ?  ? ?

## 2022-04-07 IMAGING — MG MM DIGITAL SCREENING BILAT W/ TOMO AND CAD
8 series · 8 of 24 positions shown · non-contrast
Comparison: Previous exam(s).

CLINICAL DATA: Screening.

EXAM:
DIGITAL SCREENING BILATERAL MAMMOGRAM WITH TOMOSYNTHESIS AND CAD
TECHNIQUE: Bilateral screening digital craniocaudal and mediolateral oblique
mammograms were obtained. Bilateral screening digital breast
tomosynthesis was performed. The images were evaluated with
computer-aided detection.

[L MLO synth-2D]
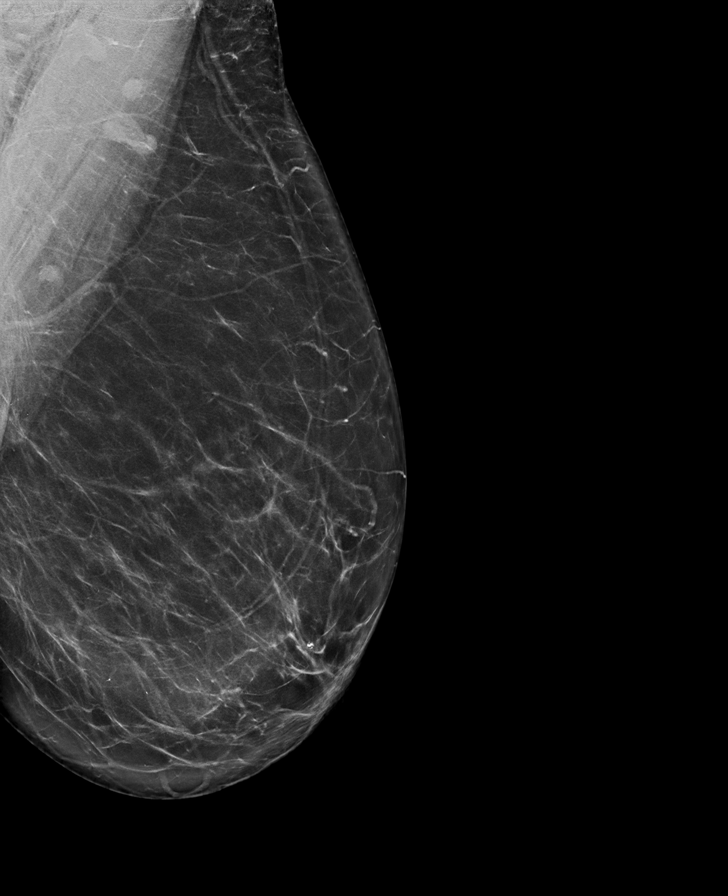

[R MLO synth-2D]
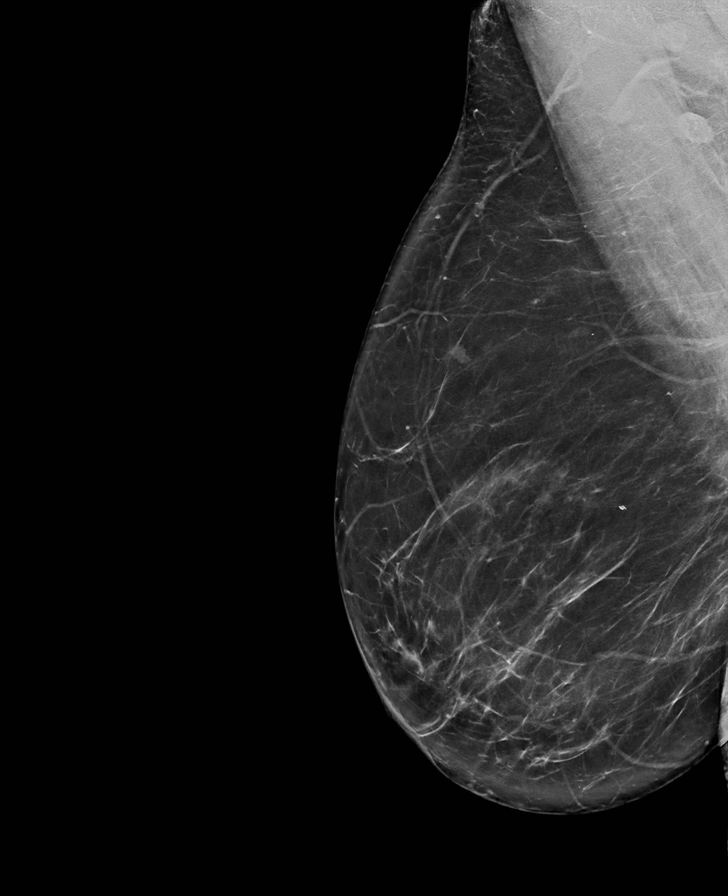

[L CC synth-2D]
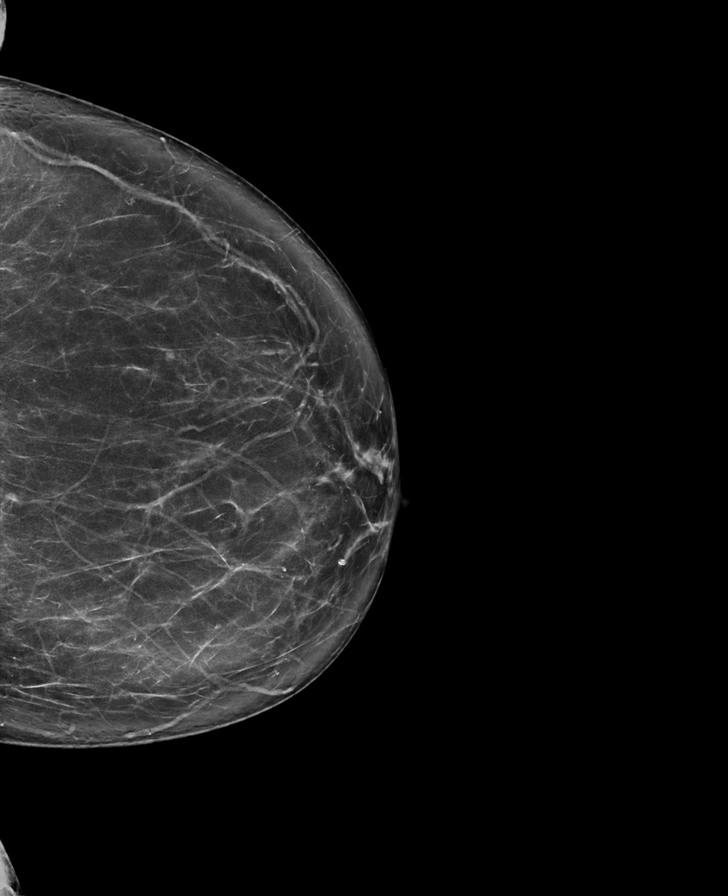

[R CC synth-2D]
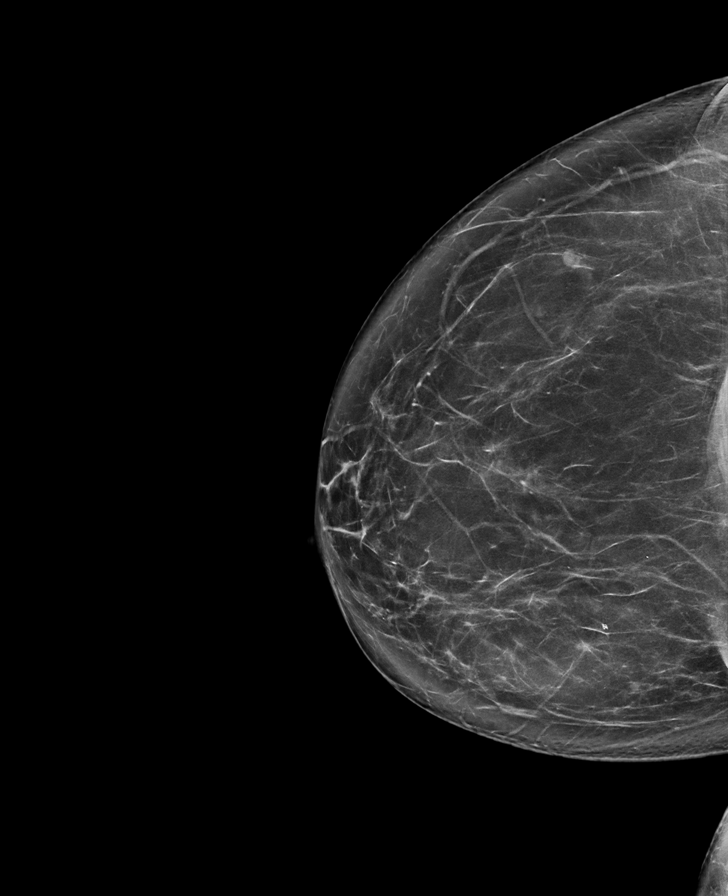

[L CC tomo · tomo slice 41/80.0]
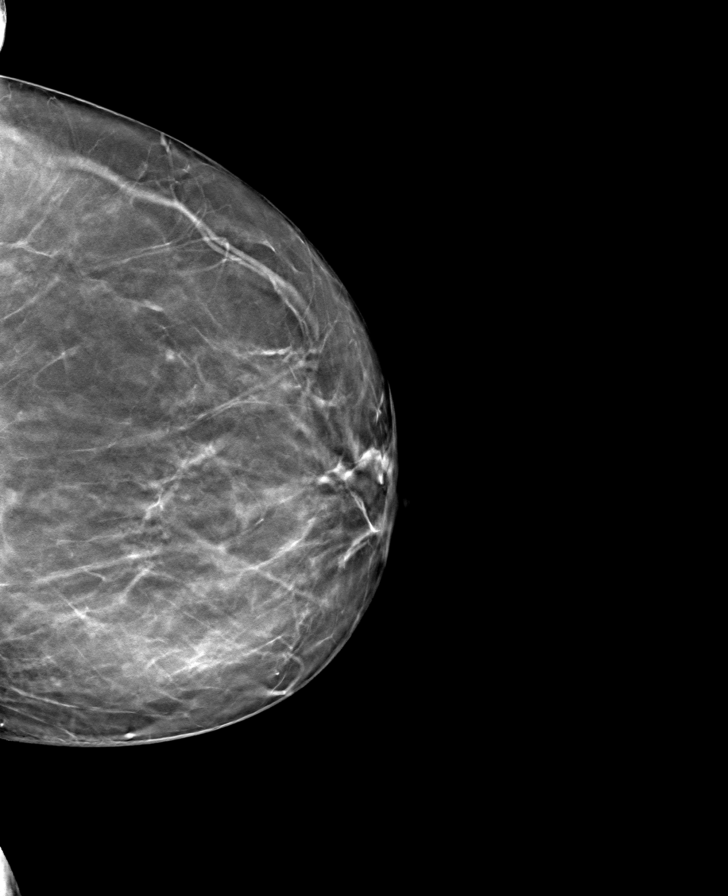

[R MLO tomo · tomo slice 45/89.0]
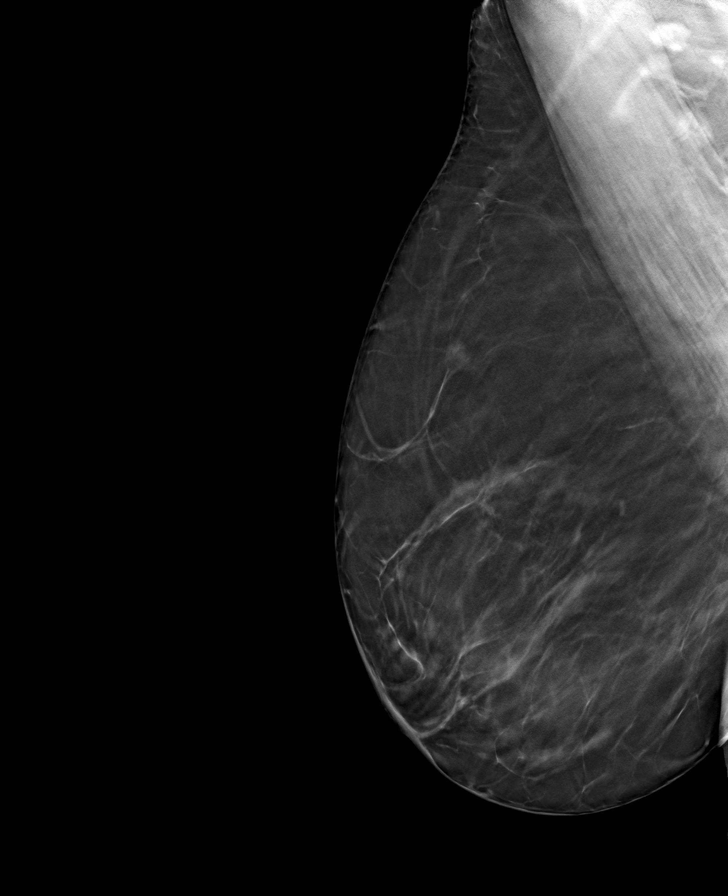

[R CC tomo · tomo slice 41/82.0]
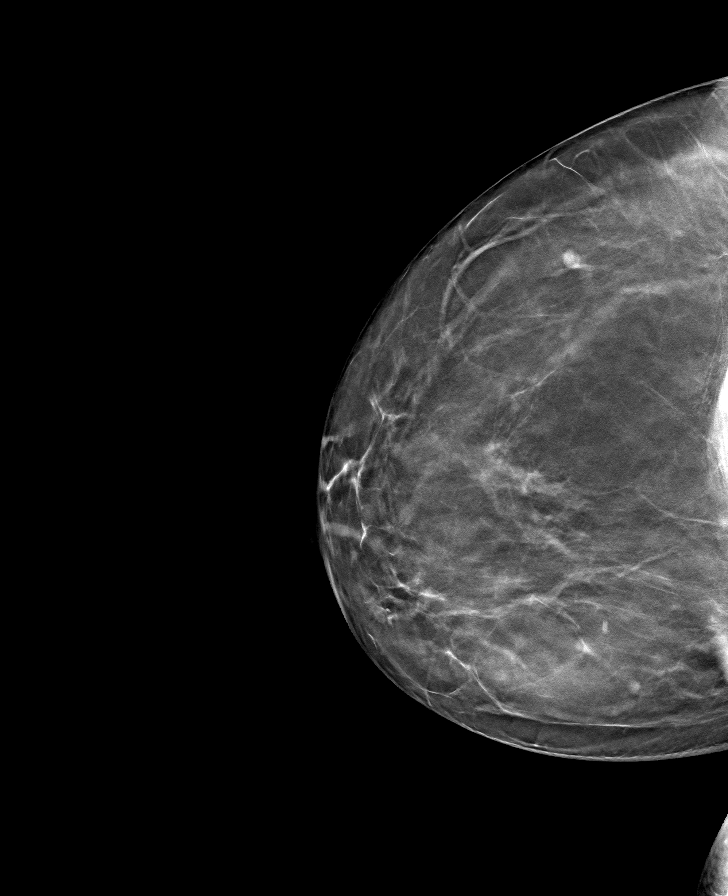

[L MLO tomo · tomo slice 43/86.0]
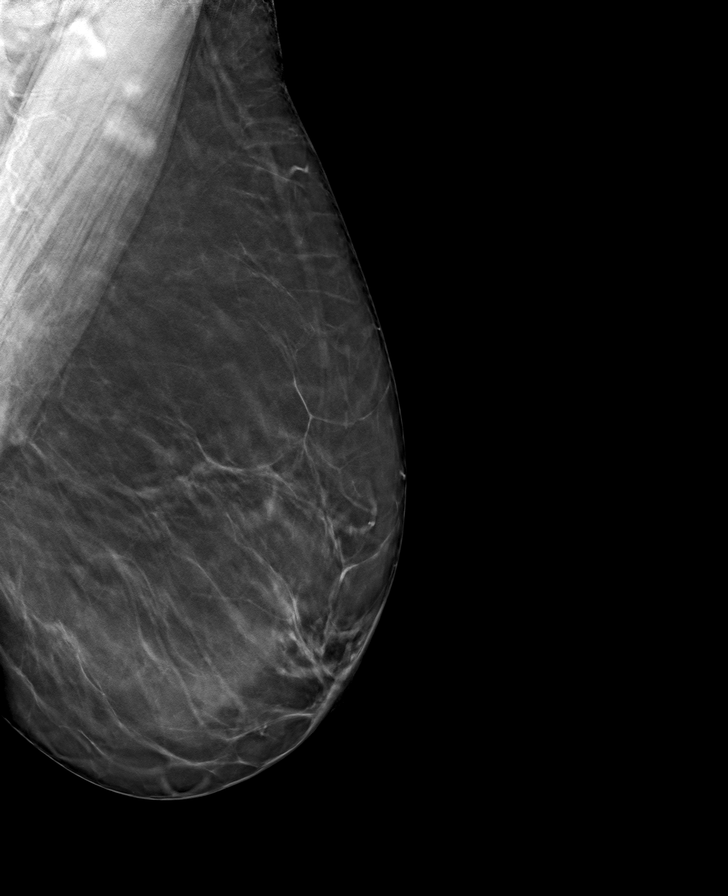

[8 of 24 positions shown; findings below may reference images not displayed]

ACR Breast Density Category b: There are scattered areas of
fibroglandular density.
FINDINGS: There are no findings suspicious for malignancy.
IMPRESSION: No mammographic evidence of malignancy. A result letter of this
screening mammogram will be mailed directly to the patient.

RECOMMENDATION:
Screening mammogram in one year. (Code:51-O-LD2)

BI-RADS CATEGORY  1: Negative.

## 2022-04-17 ENCOUNTER — Other Ambulatory Visit: Payer: Self-pay | Admitting: Family Medicine

## 2022-04-17 DIAGNOSIS — E876 Hypokalemia: Secondary | ICD-10-CM

## 2022-04-17 NOTE — Telephone Encounter (Signed)
Requested medication (s) are due for refill today: Yes  Requested medication (s) are on the active medication list: Yes  Last refill:  08/27/21  Future visit scheduled: No  Notes to clinic:  Protocol indicates lab work is needed.    Requested Prescriptions  Pending Prescriptions Disp Refills   potassium chloride (KLOR-CON) 10 MEQ tablet [Pharmacy Med Name: Potassium Chloride ER 10 MEQ Oral Tablet Extended Release] 90 tablet 0    Sig: Take 1 tablet by mouth once daily     Endocrinology:  Minerals - Potassium Supplementation Failed - 04/17/2022  2:18 PM      Failed - K in normal range and within 360 days    Potassium  Date Value Ref Range Status  08/26/2021 3.4 (L) 3.5 - 5.1 mmol/L Final         Failed - Cr in normal range and within 360 days    Creatinine, Ser  Date Value Ref Range Status  08/26/2021 1.03 (H) 0.44 - 1.00 mg/dL Final         Passed - Valid encounter within last 12 months    Recent Outpatient Visits           7 months ago Annual physical exam   [facility] Duanne Limerick, MD   8 months ago Sleep apnea-like behavior   [facility] Duanne Limerick, MD   1 year ago Facial rash   [facility] Duanne Limerick, MD   1 year ago Annual physical exam   [facility] Duanne Limerick, MD   1 year ago Essential hypertension   [facility] Duanne Limerick, MD

## 2022-04-23 ENCOUNTER — Other Ambulatory Visit: Payer: Self-pay | Admitting: Family Medicine

## 2022-04-23 DIAGNOSIS — E876 Hypokalemia: Secondary | ICD-10-CM

## 2022-06-11 ENCOUNTER — Other Ambulatory Visit: Payer: Self-pay | Admitting: Family Medicine

## 2022-06-11 DIAGNOSIS — I10 Essential (primary) hypertension: Secondary | ICD-10-CM

## 2022-06-11 NOTE — Telephone Encounter (Signed)
Pt call, voicemail left to call office and schedule appt so medication request Hydrochlorothiazide can be filled.

## 2022-06-12 NOTE — Telephone Encounter (Signed)
Left voicemail to call and make appt. For yearly physical.    A 30 day courtesy supply given of the HCTZ 25 mg tablets.

## 2022-07-23 ENCOUNTER — Other Ambulatory Visit: Payer: Self-pay | Admitting: Family Medicine

## 2022-07-23 DIAGNOSIS — I10 Essential (primary) hypertension: Secondary | ICD-10-CM

## 2022-07-27 ENCOUNTER — Other Ambulatory Visit: Payer: Self-pay | Admitting: Family Medicine

## 2022-07-27 DIAGNOSIS — I10 Essential (primary) hypertension: Secondary | ICD-10-CM

## 2022-08-04 ENCOUNTER — Encounter: Payer: Self-pay | Admitting: Family Medicine

## 2022-08-04 ENCOUNTER — Ambulatory Visit (INDEPENDENT_AMBULATORY_CARE_PROVIDER_SITE_OTHER): Payer: No Typology Code available for payment source | Admitting: Family Medicine

## 2022-08-04 VITALS — BP 120/86 | HR 60 | Ht 59.0 in | Wt 161.0 lb

## 2022-08-04 DIAGNOSIS — R7303 Prediabetes: Secondary | ICD-10-CM | POA: Diagnosis not present

## 2022-08-04 DIAGNOSIS — I10 Essential (primary) hypertension: Secondary | ICD-10-CM | POA: Diagnosis not present

## 2022-08-04 DIAGNOSIS — E876 Hypokalemia: Secondary | ICD-10-CM | POA: Diagnosis not present

## 2022-08-04 DIAGNOSIS — Z1231 Encounter for screening mammogram for malignant neoplasm of breast: Secondary | ICD-10-CM | POA: Diagnosis not present

## 2022-08-04 MED ORDER — POTASSIUM CHLORIDE CRYS ER 10 MEQ PO TBCR: 10.0000 meq | EXTENDED_RELEASE_TABLET | Freq: Every day | ORAL | 1 refills | Status: DC

## 2022-08-04 MED ORDER — HYDROCHLOROTHIAZIDE 25 MG PO TABS: ORAL_TABLET | ORAL | 1 refills | Status: DC

## 2022-08-04 NOTE — Progress Notes (Addendum)
Date:  08/04/2022   Name:  Caitlin Dean   DOB:  May 20, 1961   MRN:  [id number]   Chief Complaint: Hypertension, hypokalemia, and Prediabetes  Hypertension This is a chronic problem. The current episode started more than 1 year ago. The problem has been gradually improving since onset. The problem is controlled. Pertinent negatives include no blurred vision, chest pain, headaches, orthopnea, palpitations, PND or shortness of breath. There are no associated agents to hypertension. Past treatments include diuretics. The current treatment provides moderate improvement. Compliance problems include medication cost.  There is no history of angina, kidney disease, CAD/MI, CVA, heart failure, left ventricular hypertrophy, PVD or retinopathy. There is no history of chronic renal disease, a hypertension causing med or renovascular disease.  Diabetes She presents for her follow-up diabetic visit. Diabetes type: prediabetes. Her disease course has been stable. There are no hypoglycemic associated symptoms. Pertinent negatives for hypoglycemia include no dizziness, headaches or nervousness/anxiousness. Pertinent negatives for diabetes include no blurred vision, no chest pain, no fatigue, no polyuria, no weakness and no weight loss. There are no hypoglycemic complications. Symptoms are stable. There are no diabetic complications. Pertinent negatives for diabetic complications include no CVA, PVD or retinopathy.    Lab Results  Component Value Date   NA 138 08/26/2021   K 3.4 (L) 08/26/2021   CO2 30 08/26/2021   GLUCOSE 90 08/26/2021   BUN 20 08/26/2021   CREATININE 1.03 (H) 08/26/2021   CALCIUM 9.4 08/26/2021   GFRNONAA >60 08/26/2021   Lab Results  Component Value Date   CHOL 195 08/26/2021   HDL 63 08/26/2021   LDLCALC 123 (H) 08/26/2021   TRIG 44 08/26/2021   CHOLHDL 3.1 08/26/2021   No results found for: "TSH" Lab Results  Component Value Date   HGBA1C 5.9 (H) 08/26/2021   Lab  Results  Component Value Date   WBC 4.0 06/19/2020   HGB 13.3 06/19/2020   HCT 40.1 06/19/2020   MCV 88 06/19/2020   PLT 206 06/19/2020   Lab Results  Component Value Date   ALT 16 06/19/2020   AST 22 06/19/2020   ALKPHOS 62 06/19/2020   BILITOT 0.3 06/19/2020   No results found for: "25OHVITD2", "25OHVITD3", "VD25OH"   Review of Systems  Constitutional: Negative.  Negative for chills, fatigue, fever, unexpected weight change and weight loss.  HENT:  Negative for congestion, ear discharge, ear pain, rhinorrhea, sinus pressure, sneezing and sore throat.   Eyes:  Negative for blurred vision.  Respiratory:  Negative for cough, shortness of breath, wheezing and stridor.   Cardiovascular:  Negative for chest pain, palpitations, orthopnea and PND.  Gastrointestinal:  Negative for abdominal pain, blood in stool, constipation, diarrhea and nausea.  Endocrine: Negative for polyuria.  Genitourinary:  Negative for dysuria, flank pain, frequency, hematuria, urgency and vaginal discharge.  Musculoskeletal:  Negative for arthralgias, back pain and myalgias.  Skin:  Negative for rash.  Neurological:  Negative for dizziness, weakness and headaches.  Hematological:  Negative for adenopathy. Does not bruise/bleed easily.  Psychiatric/Behavioral:  Negative for dysphoric mood. The patient is not nervous/anxious.     Patient Active Problem List   Diagnosis Date Noted   Status post total hysterectomy 02/12/2021   Special screening for malignant neoplasms, colon    Essential hypertension 07/17/2015   Routine history and physical examination of adult 07/02/2015   Prediabetes 04/12/2012   Allergic rhinitis 03/19/2012   Middle ear effusion 03/19/2012    Allergies  Allergen Reactions  Sulfa Antibiotics Rash    Past Surgical History:  Procedure Laterality Date   ABDOMINAL HYSTERECTOMY     COLONOSCOPY WITH PROPOFOL N/A 07/11/2020   Procedure: COLONOSCOPY WITH PROPOFOL;  Surgeon: Lucilla Lame,  MD;  Location: [place];  Service: Endoscopy;  Laterality: N/A;  priority 4    Social History   Tobacco Use   Smoking status: Never   Smokeless tobacco: Never  Vaping Use   Vaping Use: Never used  Substance Use Topics   Alcohol use: Not Currently   Drug use: Never     Medication list has been reviewed and updated.  Current Meds  Medication Sig   hydrochlorothiazide (HYDRODIURIL) 25 MG tablet TAKE 1 TABLET BY MOUTH ONCE DAILY (NEEDS  REFILLS  APPOINTMENT  BEFORE  THE  END  OF  THE  YEAR)   Multiple Vitamin (MULTIVITAMIN) tablet Take 1 tablet by mouth daily.   Omega-3 Fatty Acids (FISH OIL PO) Take by mouth daily.   potassium chloride (KLOR-CON) 10 MEQ tablet Take 1 tablet (10 mEq total) by mouth once for 1 dose.       08/04/2022    3:30 PM 07/23/2021   10:17 AM 06/13/2020    1:47 PM 01/11/2020    3:40 PM  GAD 7 : Generalized Anxiety Score  Nervous, Anxious, on Edge 0 0 0 0  Control/stop worrying 0 0 0 0  Worry too much - different things 0 0 0 0  Trouble relaxing 0 0 0 0  Restless 0 0 0 0  Easily annoyed or irritable 0 0 0 0  Afraid - awful might happen 0 0 0 0  Total GAD 7 Score 0 0 0 0  Anxiety Difficulty Not difficult at all          08/04/2022    3:30 PM 07/23/2021   10:16 AM 06/13/2020    1:47 PM  Depression screen PHQ 2/9  Decreased Interest 0 0 0  Down, Depressed, Hopeless 0 0 0  PHQ - 2 Score 0 0 0  Altered sleeping 0 0 0  Tired, decreased energy 0 0 0  Change in appetite 0 0 0  Feeling bad or failure about yourself  0 0 0  Trouble concentrating 0 0 0  Moving slowly or fidgety/restless 0 0 0  Suicidal thoughts 0 0 0  PHQ-9 Score 0 0 0  Difficult doing work/chores Not difficult at all      BP Readings from Last 3 Encounters:  08/04/22 120/86  08/26/21 120/60  07/23/21 130/82    Physical Exam Vitals and nursing note reviewed. Exam conducted with a chaperone present.  Constitutional:      General: She is not in acute distress.     Appearance: She is not diaphoretic.  HENT:     Head: Normocephalic and atraumatic.     Right Ear: Tympanic membrane and external ear normal.     Left Ear: Tympanic membrane and external ear normal.     Nose: Nose normal.     Mouth/Throat:     Mouth: Mucous membranes are moist.  Eyes:     General:        Right eye: No discharge.        Left eye: No discharge.     Conjunctiva/sclera: Conjunctivae normal.     Pupils: Pupils are equal, round, and reactive to light.  Neck:     Thyroid: No thyromegaly.     Vascular: No JVD.  Cardiovascular:  Rate and Rhythm: Normal rate and regular rhythm.     Heart sounds: Normal heart sounds. No murmur heard.    No friction rub. No gallop.  Pulmonary:     Effort: Pulmonary effort is normal.     Breath sounds: Normal breath sounds. No wheezing or rhonchi.  Chest:  Breasts:    Right: Normal. No swelling, bleeding, inverted nipple, mass, nipple discharge, skin change or tenderness.     Left: Normal. No swelling, bleeding, inverted nipple, mass, nipple discharge, skin change or tenderness.  Abdominal:     General: Bowel sounds are normal.     Palpations: Abdomen is soft. There is no mass.     Tenderness: There is no abdominal tenderness. There is no guarding or rebound.  Musculoskeletal:        General: Normal range of motion.     Cervical back: Normal range of motion and neck supple.  Lymphadenopathy:     Cervical: No cervical adenopathy.  Skin:    General: Skin is warm and dry.  Neurological:     Mental Status: She is alert.     Deep Tendon Reflexes: Reflexes are normal and symmetric.     Wt Readings from Last 3 Encounters:  08/04/22 161 lb (73 kg)  08/26/21 154 lb (69.9 kg)  07/23/21 156 lb (70.8 kg)    BP 120/86   Pulse 60   Ht 4\' 11"  (1.499 m)   Wt 161 lb (73 kg)   BMI 32.52 kg/m   Assessment and Plan:  1. Essential hypertension Chronic.  Controlled.  Stable.  Blood pressure today is 120/86.  Asymptomatic.  Tolerating  medication well.  Continue hydrochlorothiazide 25 mg once a day. - hydrochlorothiazide (HYDRODIURIL) 25 MG tablet; TAKE 1 TABLET BY MOUTH ONCE DAILY  Dispense: 90 tablet; Refill: 1  2. Hypokalemia Chronic.  Controlled.  Stable.  We will hold check and lab work today because patient has not been taking her potassium supplementation.  She will take her medicine over the course of the week and then check her renal function panel. - potassium chloride (KLOR-CON M) 10 MEQ tablet; Take 1 tablet (10 mEq total) by mouth daily.  Dispense: 90 tablet; Refill: 1  3. Breast cancer screening by mammogram Breast exam is normal with no palpable mass or changes in the breast tissue.  We will schedule for 3D screening mammogram bilateral. - MM 3D SCREEN BREAST BILATERAL   4.  Prediabetes.  Chronic.  Controlled.  Stable.  This is with dietary control only.  Last A1c was noted to be 5.9.  We will continue with diet control pending next A1c that will be done next week to see if any further intervention about medication will be necessary. , MD

## 2022-08-05 ENCOUNTER — Other Ambulatory Visit: Payer: Self-pay | Admitting: Family Medicine

## 2022-08-05 ENCOUNTER — Other Ambulatory Visit: Payer: Self-pay

## 2022-08-05 DIAGNOSIS — E876 Hypokalemia: Secondary | ICD-10-CM

## 2022-08-05 MED ORDER — POTASSIUM CHLORIDE CRYS ER 10 MEQ PO TBCR
10.0000 meq | EXTENDED_RELEASE_TABLET | Freq: Every day | ORAL | 1 refills | Status: DC
  Filled 2022-08-05: qty 90, 90d supply, fill #0
  Filled 2022-11-11: qty 90, 90d supply, fill #1

## 2022-08-05 NOTE — Telephone Encounter (Signed)
Called pharmacy to verify status of medication sent to pharmacy. Medication refill signed 08/04/22. Pharmacy has not received refill request .

## 2022-08-05 NOTE — Telephone Encounter (Signed)
Requested Prescriptions  Pending Prescriptions Disp Refills  . potassium chloride (KLOR-CON M) 10 MEQ tablet 90 tablet 1    Sig: Take 1 tablet (10 mEq total) by mouth daily.     Endocrinology:  Minerals - Potassium Supplementation Failed - 08/05/2022  3:01 PM      Failed - K in normal range and within 360 days    Potassium  Date Value Ref Range Status  08/26/2021 3.4 (L) 3.5 - 5.1 mmol/L Final         Failed - Cr in normal range and within 360 days    Creatinine, Ser  Date Value Ref Range Status  08/26/2021 1.03 (H) 0.44 - 1.00 mg/dL Final         Passed - Valid encounter within last 12 months    Recent Outpatient Visits          Yesterday Essential hypertension   [facility] [place], [place], MD   11 months ago Annual physical exam   [facility] [place], Deanna C, MD   1 year ago Sleep apnea-like behavior   [facility] [place], Deanna C, MD   2 years ago Facial rash   [facility] [place], Deanna C, MD   2 years ago Annual physical exam   [facility] [place], [place], MD      Future Appointments            In 6 months Juline Patch, MD [facility]

## 2022-08-05 NOTE — Telephone Encounter (Signed)
Medication Refill - Medication: potassium chloride (KLOR-CON M) 10 MEQ tablet  Pt was advised to have medication transferred to the [facility] due to insurance.    Has the patient contacted their pharmacy? No.  (Agent: If no, request that the patient contact the pharmacy for the refill. If patient does not wish to contact the pharmacy document the reason why and proceed with request.)   Preferred Pharmacy (with phone number or street name):  [facility]  [address] [place] [address]  Phone: [phone] Fax: [phone]  Hours: M-F 7:30a-5:30p   Has the patient been seen for an appointment in the last year OR does the patient have an upcoming appointment? Yes.    Agent: Please be advised that RX refills may take up to 3 business days. We ask that you follow-up with your pharmacy.

## 2022-08-14 ENCOUNTER — Other Ambulatory Visit
Admission: RE | Admit: 2022-08-14 | Discharge: 2022-08-14 | Disposition: A | Discharge status: Home / Self Care | Payer: No Typology Code available for payment source | Attending: Family Medicine | Admitting: Family Medicine

## 2022-08-14 DIAGNOSIS — I10 Essential (primary) hypertension: Secondary | ICD-10-CM | POA: Diagnosis present

## 2022-08-14 DIAGNOSIS — R7303 Prediabetes: Secondary | ICD-10-CM | POA: Insufficient documentation

## 2022-08-14 DIAGNOSIS — E876 Hypokalemia: Secondary | ICD-10-CM | POA: Insufficient documentation

## 2022-08-14 LAB — COMPREHENSIVE METABOLIC PANEL
ALT: 23 U/L (ref 0–44)
AST: 24 U/L (ref 15–41)
Albumin: 4.1 g/dL (ref 3.5–5.0)
Alkaline Phosphatase: 57 U/L (ref 38–126)
Anion gap: 7 (ref 5–15)
BUN: 15 mg/dL (ref 6–20)
CO2: 29 mmol/L (ref 22–32)
Calcium: 9.1 mg/dL (ref 8.9–10.3)
Chloride: 105 mmol/L (ref 98–111)
Creatinine, Ser: 1.05 mg/dL — ABNORMAL HIGH (ref 0.44–1.00)
GFR, Estimated: >60 mL/min (ref >60)
Glucose, Bld: 97 mg/dL (ref 70–99)
Potassium: 3.4 mmol/L — ABNORMAL LOW (ref 3.5–5.1)
Sodium: 141 mmol/L (ref 135–145)
Total Bilirubin: 0.3 mg/dL (ref 0.3–1.2)
Total Protein: 6.8 g/dL (ref 6.5–8.1)

## 2022-08-14 LAB — HEMOGLOBIN A1C
Hgb A1c MFr Bld: 5.9 % — ABNORMAL HIGH (ref 4.8–5.6)
Mean Plasma Glucose: 122.63 mg/dL

## 2022-09-01 ENCOUNTER — Ambulatory Visit
Admission: RE | Admit: 2022-09-01 | Discharge: 2022-09-01 | Disposition: A | Discharge status: Home / Self Care | Payer: No Typology Code available for payment source | Source: Ambulatory Visit | Attending: Family Medicine | Admitting: Family Medicine

## 2022-09-01 DIAGNOSIS — Z1231 Encounter for screening mammogram for malignant neoplasm of breast: Secondary | ICD-10-CM | POA: Diagnosis not present

## 2022-09-18 ENCOUNTER — Ambulatory Visit
Admission: EM | Admit: 2022-09-18 | Discharge: 2022-09-18 | Disposition: A | Discharge status: Home / Self Care | Payer: No Typology Code available for payment source | Attending: Emergency Medicine | Admitting: Emergency Medicine

## 2022-09-18 DIAGNOSIS — B349 Viral infection, unspecified: Secondary | ICD-10-CM | POA: Diagnosis not present

## 2022-09-18 DIAGNOSIS — R6883 Chills (without fever): Secondary | ICD-10-CM | POA: Diagnosis present

## 2022-09-18 DIAGNOSIS — U071 COVID-19: Secondary | ICD-10-CM | POA: Insufficient documentation

## 2022-09-18 DIAGNOSIS — R0981 Nasal congestion: Secondary | ICD-10-CM | POA: Diagnosis present

## 2022-09-18 DIAGNOSIS — R059 Cough, unspecified: Secondary | ICD-10-CM | POA: Diagnosis present

## 2022-09-18 NOTE — ED Triage Notes (Addendum)
Patient to Urgent Care with complaints of fatigue, chills and chest congestion x1 day. Reports dry cough. Denies any known fevers.  Has been taking tylenol and using vicks vapor rub.

## 2022-09-18 NOTE — ED Provider Notes (Signed)
Renaldo Fiddler    CSN: [id number] Arrival date & time: 09/18/22  0830      History   Chief Complaint Chief Complaint  Patient presents with   Chills    HPI Caitlin Dean is a 61 y.o. female.  Patient presents with chills, fatigue, congestion, cough x1 day.  No fever, rash, sore throat, shortness of breath, vomiting, diarrhea, or other symptoms.  Treatment at home with Tylenol.  Her medical history includes hypertension, prediabetes, allergic rhinitis.  The history is provided by the patient and medical records.    Past Medical History:  Diagnosis Date   Hypertension    Pre-diabetes     Patient Active Problem List   Diagnosis Date Noted   Chills 09/18/2022   Status post total hysterectomy 02/12/2021   Special screening for malignant neoplasms, colon    Essential hypertension 07/17/2015   Routine history and physical examination of adult 07/02/2015   Prediabetes 04/12/2012   Allergic rhinitis 03/19/2012   Middle ear effusion 03/19/2012    Past Surgical History:  Procedure Laterality Date   ABDOMINAL HYSTERECTOMY     COLONOSCOPY WITH PROPOFOL N/A 07/11/2020   Procedure: COLONOSCOPY WITH PROPOFOL;  Surgeon: Midge Minium, MD;  Location: [facility];  Service: Endoscopy;  Laterality: N/A;  priority 4    OB History   No obstetric history on file.      Home Medications    Prior to Admission medications   Medication Sig Start Date End Date Taking? Authorizing Provider  hydrochlorothiazide (HYDRODIURIL) 25 MG tablet TAKE 1 TABLET BY MOUTH ONCE DAILY 08/04/22   Duanne Limerick, MD  Multiple Vitamin (MULTIVITAMIN) tablet Take 1 tablet by mouth daily.    [provider]  Omega-3 Fatty Acids (FISH OIL PO) Take by mouth daily.    [provider]  potassium chloride (KLOR-CON M) 10 MEQ tablet Take 1 tablet (10 mEq total) by mouth daily. 08/05/22   Duanne Limerick, MD    Family History Family History  Problem Relation Age of Onset    Hypertension Mother    Cancer Paternal Grandfather    Breast cancer Neg Hx     Social History Social History   Tobacco Use   Smoking status: Never   Smokeless tobacco: Never  Vaping Use   Vaping Use: Never used  Substance Use Topics   Alcohol use: Not Currently   Drug use: Never     Allergies   Sulfa antibiotics   Review of Systems Review of Systems  Constitutional:  Positive for chills and fatigue. Negative for fever.  HENT:  Positive for congestion. Negative for ear pain and sore throat.   Respiratory:  Positive for cough. Negative for shortness of breath.   Cardiovascular:  Negative for chest pain and palpitations.  Gastrointestinal:  Negative for diarrhea and vomiting.  Skin:  Negative for color change and rash.  All other systems reviewed and are negative.    Physical Exam Triage Vital Signs ED Triage Vitals  Enc Vitals Group     BP --      Pulse Rate 09/18/22 0838 89     Resp 09/18/22 0838 16     Temp 09/18/22 0838 98.1 F (36.7 C)     Temp src --      SpO2 09/18/22 0838 95 %     Weight 09/18/22 0841 159 lb (72.1 kg)     Height 09/18/22 0841 4\' 11"  (1.499 m)     Head Circumference --  Peak Flow --      Pain Score 09/18/22 0839 1     Pain Loc --      Pain Edu? --      Excl. in [place]? --    No data found.  Updated Vital Signs BP 123/81   Pulse 89   Temp 98.1 F (36.7 C)   Resp 16   Ht 4\' 11"  (1.499 m)   Wt 159 lb (72.1 kg)   SpO2 95%   BMI 32.11 kg/m   Visual Acuity Right Eye Distance:   Left Eye Distance:   Bilateral Distance:    Right Eye Near:   Left Eye Near:    Bilateral Near:     Physical Exam Vitals and nursing note reviewed.  Constitutional:      General: She is not in acute distress.    Appearance: She is well-developed. She is not ill-appearing.  HENT:     Right Ear: Tympanic membrane normal.     Left Ear: Tympanic membrane normal.     Nose: Nose normal.     Mouth/Throat:     Mouth: Mucous membranes are moist.      Pharynx: Oropharynx is clear.  Cardiovascular:     Rate and Rhythm: Normal rate and regular rhythm.     Heart sounds: Normal heart sounds.  Pulmonary:     Effort: Pulmonary effort is normal. No respiratory distress.     Breath sounds: Normal breath sounds.  Musculoskeletal:     Cervical back: Neck supple.  Skin:    General: Skin is warm and dry.  Neurological:     Mental Status: She is alert.  Psychiatric:        Mood and Affect: Mood normal.        Behavior: Behavior normal.      UC Treatments / Results  Labs (all labs ordered are listed, but only abnormal results are displayed) Labs Reviewed  SARS CORONAVIRUS 2 (TAT 6-24 HRS)    EKG   Radiology No results found.  Procedures Procedures (including critical care time)  Medications Ordered in UC Medications - No data to display  Initial Impression / Assessment and Plan / UC Course  I have reviewed the triage vital signs and the nursing notes.  Pertinent labs & imaging results that were available during my care of the patient were reviewed by me and considered in my medical decision making (see chart for details).    Viral illness, chills.  COVID pending.  Discussed symptomatic treatment including Tylenol or ibuprofen, rest, hydration.  Instructed patient to follow up with PCP if symptoms are not improving.  She agrees to plan of care.   Final Clinical Impressions(s) / UC Diagnoses   Final diagnoses:  Chills  Viral illness     Discharge Instructions      Your COVID test is pending.  Take Tylenol or ibuprofen as needed for fever or discomfort.  Rest and keep yourself hydrated.    Follow-up with your primary care provider if your symptoms are not improving.         ED Prescriptions   None    PDMP not reviewed this encounter.   Sharion Balloon, NP 09/18/22 [phone]

## 2022-09-18 NOTE — Discharge Instructions (Addendum)
Your COVID test is pending.    Take Tylenol or ibuprofen as needed for fever or discomfort.  Rest and keep yourself hydrated.    Follow-up with your primary care provider if your symptoms are not improving.     

## 2022-09-19 LAB — SARS CORONAVIRUS 2 (TAT 6-24 HRS): SARS Coronavirus 2: POSITIVE — AB

## 2022-09-21 ENCOUNTER — Ambulatory Visit: Payer: Self-pay | Admitting: *Deleted

## 2022-09-21 NOTE — Telephone Encounter (Signed)
Noted  PEC gave instructions for home care.  KP

## 2022-09-21 NOTE — Telephone Encounter (Signed)
  Chief Complaint: Covid Positive Symptoms: Fatigued, voice hoarse Frequency: Tested Friday at [facility], symptoms started last Wednesday, cough, fatigue. Pertinent Negatives: Patient denies Fever, cough, no symptoms except voice hoarse. Disposition: _0 ED /_1 Urgent Care (no appt availability in office) / _2 Appointment(In office/virtual)/ _3  [facility]/ _4 Home Care/ _5 Refused Recommended Disposition /_6 [facility]/ _7  Follow-up with PCP Additional Notes:Home care advise provided, pt verbalizes understanding. Self isolation guidelines reviewed, pt verbalizes understanding,  Reason for Disposition  [1] COVID-19 diagnosed by positive lab test (e.g., PCR, rapid self-test kit) AND [2] mild symptoms (e.g., cough, fever, others) AND [0] no complications or SOB  Answer Assessment - Initial Assessment Questions 1. COVID-19 DIAGNOSIS: "How do you know that you have COVID?" (e.g., positive lab test or self-test, diagnosed by doctor or NP/PA, symptoms after exposure).     [facility], Friday 2. COVID-19 EXPOSURE: "Was there any known exposure to COVID before the symptoms began?" CDC Definition of close contact: within 6 feet (2 meters) for a total of 15 minutes or more over a 24-hour period.      No 3. ONSET: "When did the COVID-19 symptoms start?"      Wednesday 4. WORST SYMPTOM: "What is your worst symptom?" (e.g., cough, fever, shortness of breath, muscle aches)     "Feeling better" 5. COUGH: "Do you have a cough?" If Yes, ask: "How bad is the cough?"       No cough, hoarse voice 6. FEVER: "Do you have a fever?" If Yes, ask: "What is your temperature, how was it measured, and when did it start?"     No 7. RESPIRATORY STATUS: "Describe your breathing?" (e.g., normal; shortness of breath, wheezing, unable to speak)      *No Answer* 8. BETTER-SAME-WORSE: "Are you getting better, staying the same or getting worse compared to yesterday?"  If getting worse, ask, "In what way?"     *No  Answer* 9. OTHER SYMPTOMS: "Do you have any other symptoms?"  (e.g., chills, fatigue, headache, loss of smell or taste, muscle pain, sore throat)     Fatigued 10. HIGH RISK DISEASE: "Do you have any chronic medical problems?" (e.g., asthma, heart or lung disease, weak immune system, obesity, etc.)       *No Answer* 11. VACCINE: "Have you had the COVID-19 vaccine?" If Yes, ask: "Which one, how many shots, when did you get it?"       All up until this year  Protocols used: Coronavirus (COVID-19) Diagnosed or Suspected-A-AH

## 2022-10-02 ENCOUNTER — Telehealth: Payer: Self-pay

## 2022-10-02 NOTE — Telephone Encounter (Signed)
Called pt to ask about pap status- had to leave message

## 2022-11-11 ENCOUNTER — Other Ambulatory Visit: Payer: Self-pay

## 2022-11-11 MED ORDER — HYDROCHLOROTHIAZIDE 25 MG PO TABS
25.0000 mg | ORAL_TABLET | Freq: Every day | ORAL | 1 refills | Status: DC
  Filled 2022-11-11: qty 90, 90d supply, fill #0

## 2022-11-11 MED ORDER — POTASSIUM CHLORIDE ER 10 MEQ PO TBCR
10.0000 meq | EXTENDED_RELEASE_TABLET | Freq: Every day | ORAL | 1 refills | Status: DC
  Filled 2022-11-11: qty 90, 90d supply, fill #0

## 2022-11-12 ENCOUNTER — Other Ambulatory Visit: Payer: Self-pay

## 2022-11-12 ENCOUNTER — Telehealth: Payer: Self-pay | Admitting: Family Medicine

## 2022-11-12 DIAGNOSIS — E876 Hypokalemia: Secondary | ICD-10-CM

## 2022-11-12 NOTE — Telephone Encounter (Signed)
Copied from [place] [id number]. Topic: General - Other >> Nov 12, 2022  9:33 AM Cyndi Bender wrote: Reason for CRM: Pt stated she was suppose to come in for labs because her medication was increased. Pt requests to come in for labs but the last order was created 08/26/21. Pt requests call back to advise when she can come in for labs. Cb# [phone]

## 2022-11-16 ENCOUNTER — Other Ambulatory Visit (HOSPITAL_BASED_OUTPATIENT_CLINIC_OR_DEPARTMENT_OTHER): Payer: Self-pay

## 2022-11-16 MED ORDER — COMIRNATY 30 MCG/0.3ML IM SUSY
0.3000 mL | PREFILLED_SYRINGE | Freq: Once | INTRAMUSCULAR | 0 refills | Status: AC
  Filled 2022-11-16: qty 0.3, 1d supply, fill #0

## 2022-11-17 ENCOUNTER — Other Ambulatory Visit
Admission: RE | Admit: 2022-11-17 | Discharge: 2022-11-17 | Disposition: A | Discharge status: Home / Self Care | Payer: 59 | Attending: Family Medicine | Admitting: Family Medicine

## 2022-11-17 DIAGNOSIS — E876 Hypokalemia: Secondary | ICD-10-CM | POA: Diagnosis not present

## 2022-11-17 LAB — RENAL FUNCTION PANEL
Albumin: 4.4 g/dL (ref 3.5–5.0)
Anion gap: 6 (ref 5–15)
BUN: 14 mg/dL (ref 6–20)
CO2: 32 mmol/L (ref 22–32)
Calcium: 8.9 mg/dL (ref 8.9–10.3)
Chloride: 100 mmol/L (ref 98–111)
Creatinine, Ser: 0.98 mg/dL (ref 0.44–1.00)
GFR, Estimated: >60 mL/min (ref >60)
Glucose, Bld: 89 mg/dL (ref 70–99)
Phosphorus: 4.3 mg/dL (ref 2.5–4.6)
Potassium: 3.7 mmol/L (ref 3.5–5.1)
Sodium: 138 mmol/L (ref 135–145)

## 2022-12-31 ENCOUNTER — Telehealth: Payer: Self-pay | Admitting: Family Medicine

## 2022-12-31 ENCOUNTER — Ambulatory Visit (INDEPENDENT_AMBULATORY_CARE_PROVIDER_SITE_OTHER): Payer: 59 | Admitting: Family Medicine

## 2022-12-31 ENCOUNTER — Ambulatory Visit: Payer: Self-pay

## 2022-12-31 ENCOUNTER — Other Ambulatory Visit
Admission: RE | Admit: 2022-12-31 | Discharge: 2022-12-31 | Disposition: A | Discharge status: Home / Self Care | Payer: 59 | Attending: Family Medicine | Admitting: Family Medicine

## 2022-12-31 ENCOUNTER — Other Ambulatory Visit: Payer: Self-pay

## 2022-12-31 ENCOUNTER — Telehealth: Payer: Self-pay

## 2022-12-31 ENCOUNTER — Encounter: Payer: Self-pay | Admitting: Family Medicine

## 2022-12-31 VITALS — BP 128/78 | HR 67 | Ht 59.0 in | Wt 156.0 lb

## 2022-12-31 DIAGNOSIS — E876 Hypokalemia: Secondary | ICD-10-CM | POA: Diagnosis not present

## 2022-12-31 DIAGNOSIS — I10 Essential (primary) hypertension: Secondary | ICD-10-CM

## 2022-12-31 DIAGNOSIS — E7801 Familial hypercholesterolemia: Secondary | ICD-10-CM

## 2022-12-31 DIAGNOSIS — H6121 Impacted cerumen, right ear: Secondary | ICD-10-CM

## 2022-12-31 LAB — COMPREHENSIVE METABOLIC PANEL
ALT: 20 U/L (ref 0–44)
AST: 24 U/L (ref 15–41)
Albumin: 4.4 g/dL (ref 3.5–5.0)
Alkaline Phosphatase: 50 U/L (ref 38–126)
Anion gap: 10 (ref 5–15)
BUN: 17 mg/dL (ref 6–20)
CO2: 29 mmol/L (ref 22–32)
Calcium: 8.8 mg/dL — ABNORMAL LOW (ref 8.9–10.3)
Chloride: 98 mmol/L (ref 98–111)
Creatinine, Ser: 1 mg/dL (ref 0.44–1.00)
GFR, Estimated: >60 mL/min (ref >60)
Glucose, Bld: 101 mg/dL — ABNORMAL HIGH (ref 70–99)
Potassium: 3.9 mmol/L (ref 3.5–5.1)
Sodium: 137 mmol/L (ref 135–145)
Total Bilirubin: 0.6 mg/dL (ref 0.3–1.2)
Total Protein: 7.1 g/dL (ref 6.5–8.1)

## 2022-12-31 LAB — LIPID PANEL
Cholesterol: 207 mg/dL — ABNORMAL HIGH (ref 0–200)
HDL: 66 mg/dL (ref >40)
LDL Cholesterol: 127 mg/dL — ABNORMAL HIGH (ref 0–99)
Total CHOL/HDL Ratio: 3.1 RATIO
Triglycerides: 69 mg/dL (ref <150)
VLDL: 14 mg/dL (ref 0–40)

## 2022-12-31 MED ORDER — POTASSIUM CHLORIDE CRYS ER 20 MEQ PO TBCR
10.0000 meq | EXTENDED_RELEASE_TABLET | Freq: Every day | ORAL | 1 refills | Status: DC
  Filled 2022-12-31: qty 45, 90d supply, fill #0

## 2022-12-31 MED ORDER — POTASSIUM CHLORIDE CRYS ER 20 MEQ PO TBCR
20.0000 meq | EXTENDED_RELEASE_TABLET | Freq: Every day | ORAL | 1 refills | Status: DC
  Filled 2022-12-31 – 2023-02-11 (×3): qty 90, 90d supply, fill #0
  Filled 2023-05-08: qty 90, 90d supply, fill #1

## 2022-12-31 MED ORDER — HYDROCHLOROTHIAZIDE 25 MG PO TABS
25.0000 mg | ORAL_TABLET | Freq: Every day | ORAL | 1 refills | Status: DC
  Filled 2022-12-31: qty 90, fill #0
  Filled 2023-02-04: qty 90, 90d supply, fill #0
  Filled 2023-05-08: qty 90, 90d supply, fill #1

## 2022-12-31 NOTE — Telephone Encounter (Signed)
Pt states that she missed a call regarding medication management. Please advise CB- [phone]

## 2022-12-31 NOTE — Patient Instructions (Signed)
..  mmc

## 2022-12-31 NOTE — Telephone Encounter (Signed)
Copied from [place]. Topic: General - Inquiry >> Dec 31, 2022 12:56 PM Erskine Squibb wrote: Reason for CRM: Patient called in returning Tara's call concerning a clinical call that was placed earlier. Please assist patient further.

## 2022-12-31 NOTE — Progress Notes (Signed)
Date:  12/31/2022   Name:  Caitlin Dean   DOB:  04-24-1961   MRN:  [id number]   Chief Complaint: Hypertension and hypokalemia  Hypertension This is a chronic problem. The current episode started more than 1 year ago. The problem has been gradually improving since onset. The problem is controlled. Pertinent negatives include no chest pain, orthopnea, palpitations, PND or shortness of breath. There are no associated agents to hypertension. There are no known risk factors for coronary artery disease. Past treatments include ACE inhibitors and diuretics. The current treatment provides moderate improvement. There are no compliance problems.  There is no history of angina, kidney disease, CAD/MI or CVA. There is no history of chronic renal disease, a hypertension causing med or renovascular disease.    Lab Results  Component Value Date   NA 138 11/17/2022   K 3.7 11/17/2022   CO2 32 11/17/2022   GLUCOSE 89 11/17/2022   BUN 14 11/17/2022   CREATININE 0.98 11/17/2022   CALCIUM 8.9 11/17/2022   GFRNONAA >60 11/17/2022   Lab Results  Component Value Date   CHOL 195 08/26/2021   HDL 63 08/26/2021   LDLCALC 123 (H) 08/26/2021   TRIG 44 08/26/2021   CHOLHDL 3.1 08/26/2021   No results found for: "TSH" Lab Results  Component Value Date   HGBA1C 5.9 (H) 08/14/2022   Lab Results  Component Value Date   WBC 4.0 06/19/2020   HGB 13.3 06/19/2020   HCT 40.1 06/19/2020   MCV 88 06/19/2020   PLT 206 06/19/2020   Lab Results  Component Value Date   ALT 23 08/14/2022   AST 24 08/14/2022   ALKPHOS 57 08/14/2022   BILITOT 0.3 08/14/2022   No results found for: "25OHVITD2", "25OHVITD3", "VD25OH"   Review of Systems  HENT:  Positive for hearing loss. Negative for trouble swallowing.   Respiratory:  Negative for cough, chest tightness, shortness of breath and wheezing.   Cardiovascular:  Negative for chest pain, palpitations, orthopnea, leg swelling and PND.  Gastrointestinal:   Negative for abdominal pain and blood in stool.  Endocrine: Negative for polydipsia and polyuria.  Genitourinary:  Negative for difficulty urinating and menstrual problem.  Neurological:  Negative for dizziness.    Patient Active Problem List   Diagnosis Date Noted   Chills 09/18/2022   Status post total hysterectomy 02/12/2021   Special screening for malignant neoplasms, colon    Essential hypertension 07/17/2015   Routine history and physical examination of adult 07/02/2015   Prediabetes 04/12/2012   Allergic rhinitis 03/19/2012   Middle ear effusion 03/19/2012    Allergies  Allergen Reactions   Sulfa Antibiotics Rash    Past Surgical History:  Procedure Laterality Date   ABDOMINAL HYSTERECTOMY     COLONOSCOPY WITH PROPOFOL N/A 07/11/2020   Procedure: COLONOSCOPY WITH PROPOFOL;  Surgeon: Lucilla Lame, MD;  Location: [facility];  Service: Endoscopy;  Laterality: N/A;  priority 4    Social History   Tobacco Use   Smoking status: Never   Smokeless tobacco: Never  Vaping Use   Vaping Use: Never used  Substance Use Topics   Alcohol use: Not Currently   Drug use: Never     Medication list has been reviewed and updated.  Current Meds  Medication Sig   hydrochlorothiazide (HYDRODIURIL) 25 MG tablet TAKE 1 TABLET BY MOUTH ONCE DAILY   Multiple Vitamin (MULTIVITAMIN) tablet Take 1 tablet by mouth daily.   Omega-3 Fatty Acids (FISH OIL PO) Take  by mouth daily.   potassium chloride (KLOR-CON M) 10 MEQ tablet Take 1 tablet (10 mEq total) by mouth daily.       12/31/2022    8:33 AM 08/04/2022    3:30 PM 07/23/2021   10:17 AM 06/13/2020    1:47 PM  GAD 7 : Generalized Anxiety Score  Nervous, Anxious, on Edge 0 0 0 0  Control/stop worrying 0 0 0 0  Worry too much - different things 0 0 0 0  Trouble relaxing 0 0 0 0  Restless 0 0 0 0  Easily annoyed or irritable 0 0 0 0  Afraid - awful might happen 0 0 0 0  Total GAD 7 Score 0 0 0 0  Anxiety Difficulty Not  difficult at all Not difficult at all         12/31/2022    8:33 AM 08/04/2022    3:30 PM 07/23/2021   10:16 AM  Depression screen PHQ 2/9  Decreased Interest 0 0 0  Down, Depressed, Hopeless 0 0 0  PHQ - 2 Score 0 0 0  Altered sleeping 0 0 0  Tired, decreased energy 0 0 0  Change in appetite 0 0 0  Feeling bad or failure about yourself  0 0 0  Trouble concentrating 0 0 0  Moving slowly or fidgety/restless 0 0 0  Suicidal thoughts 0 0 0  PHQ-9 Score 0 0 0  Difficult doing work/chores Not difficult at all Not difficult at all     BP Readings from Last 3 Encounters:  12/31/22 128/78  09/18/22 123/81  08/04/22 120/86    Physical Exam Vitals and nursing note reviewed. Exam conducted with a chaperone present.  Constitutional:      General: She is not in acute distress.    Appearance: She is not diaphoretic.  HENT:     Head: Normocephalic and atraumatic.     Right Ear: External ear normal. Decreased hearing noted. There is impacted cerumen.     Left Ear: Hearing, tympanic membrane and external ear normal. No decreased hearing noted.     Nose: Nose normal.  Eyes:     General:        Right eye: No discharge.        Left eye: No discharge.     Conjunctiva/sclera: Conjunctivae normal.     Pupils: Pupils are equal, round, and reactive to light.  Neck:     Thyroid: No thyromegaly.     Vascular: No JVD.  Cardiovascular:     Rate and Rhythm: Normal rate and regular rhythm.     Heart sounds: Normal heart sounds, S1 normal and S2 normal. No murmur heard.    No systolic murmur is present.     No diastolic murmur is present.     No friction rub. No gallop. No S3 or S4 sounds.  Pulmonary:     Effort: Pulmonary effort is normal.     Breath sounds: Normal breath sounds. No decreased breath sounds, wheezing, rhonchi or rales.  Abdominal:     General: Bowel sounds are normal.     Palpations: Abdomen is soft. There is no mass.     Tenderness: There is no abdominal tenderness. There  is no guarding.  Musculoskeletal:        General: Normal range of motion.     Cervical back: Normal range of motion and neck supple.  Lymphadenopathy:     Cervical: No cervical adenopathy.  Skin:    General:  Skin is warm and dry.  Neurological:     Mental Status: She is alert.     Deep Tendon Reflexes: Reflexes are normal and symmetric.     Wt Readings from Last 3 Encounters:  12/31/22 156 lb (70.8 kg)  09/18/22 159 lb (72.1 kg)  08/04/22 161 lb (73 kg)    BP 128/78   Pulse 67   Ht '4\' 11"'$  (1.499 m)   Wt 156 lb (70.8 kg)   SpO2 97%   BMI 31.51 kg/m   Assessment and Plan:  1. Essential hypertension Chronic.  Controlled.  Stable.  Blood pressure 128/78.  Asymptomatic.  Tolerating medication well.  Continue hydrochlorothiazide 25 mg once a day.  Will check CMP for electrolytes and GFR.  Will recheck patient in 6 months. - hydrochlorothiazide (HYDRODIURIL) 25 MG tablet; TAKE 1 TABLET BY MOUTH ONCE DAILY  Dispense: 90 tablet; Refill: 1 - Comprehensive Metabolic Panel (CMET)  2. Hypokalemia Chronic.  Controlled.  Currently supplemented with potassium chloride 10 mEq daily.  Will recheck with electrolytes and determine if sufficient supplementation. - potassium chloride (KLOR-CON M) 20 MEQ tablet; Take 0.5 tablets (10 mEq total) by mouth daily.  Dispense: 90 tablet; Refill: 1  3. Impacted cerumen of right ear Chronic.  Episodic.  Patient feels like there is a decrease in hearing on the right side and similar to previous episodes when she has had cerumen impaction on examination there is noted to be an impaction of cerumen.  Also suggested on a proactive to pick up some Debrox to use the once a week just in the shower with a bulb syringe gently to irrigate around the ear to remove any excess accumulation of wax. - Ambulatory referral to ENT  4. Familial hypercholesterolemia Chronic.  Controlled.  Stable.  BMI was noted at 31.5 patient has been given low-cholesterol low triglyceride  dietary guidelines and we will check lipid panel for current status of LDL surveillance. - Lipid Panel With LDL/HDL Ratio    Otilio Miu, MD

## 2022-12-31 NOTE — Progress Notes (Signed)
Sent in new RX

## 2022-12-31 NOTE — Telephone Encounter (Signed)
Called pt to discuss potassium supplement dosing- had to leave message

## 2022-12-31 NOTE — Telephone Encounter (Signed)
Message from Luciana Axe sent at 12/31/2022  9:55 AM EST  Summary: Medication Advice   Pt is calling for advice on how to take the Rx #: [id number] potassium chloride SA (KLOR-CON M) 20 MEQ tablet [id number] Please advise        Pt would like clarification on dose of Potassium. Pt stated she is concerned the wrong dose was called. Advised what PCP wrote in note and how prescription was written. Please review.  Reason for Disposition  [1] Caller has NON-URGENT medicine question about med that PCP prescribed AND [2] triager unable to answer question  Answer Assessment - Initial Assessment Questions 1. NAME of MEDICINE: "What medicine(s) are you calling about?"     KCL 2. QUESTION: "What is your question?" (e.g., double dose of medicine, side effect)     How to take it  3. PRESCRIBER: "Who prescribed the medicine?" Reason: if prescribed by specialist, call should be referred to that group.     PCP 4. SYMPTOMS: "Do you have any symptoms?" If Yes, ask: "What symptoms are you having?"  "How bad are the symptoms (e.g., mild, moderate, severe)     N/a 5. PREGNANCY:  "Is there any chance that you are pregnant?" "When was your last menstrual period?"     N/a  Protocols used: Medication Question Call-A-AH

## 2022-12-31 NOTE — Telephone Encounter (Signed)
Called pt after receiving form for Dr Ileene Hutchinson appt- 01/25/2023 @ 8:40 in [employer] [phone]

## 2023-01-25 DIAGNOSIS — H6123 Impacted cerumen, bilateral: Secondary | ICD-10-CM | POA: Diagnosis not present

## 2023-01-25 DIAGNOSIS — H9201 Otalgia, right ear: Secondary | ICD-10-CM | POA: Diagnosis not present

## 2023-01-25 DIAGNOSIS — M26621 Arthralgia of right temporomandibular joint: Secondary | ICD-10-CM | POA: Diagnosis not present

## 2023-02-04 ENCOUNTER — Other Ambulatory Visit: Payer: Self-pay

## 2023-02-04 ENCOUNTER — Ambulatory Visit: Payer: No Typology Code available for payment source | Admitting: Family Medicine

## 2023-02-11 ENCOUNTER — Other Ambulatory Visit: Payer: Self-pay

## 2023-06-28 ENCOUNTER — Encounter: Payer: Self-pay | Admitting: Family Medicine

## 2023-06-28 ENCOUNTER — Ambulatory Visit (INDEPENDENT_AMBULATORY_CARE_PROVIDER_SITE_OTHER): Payer: 59 | Admitting: Family Medicine

## 2023-06-28 ENCOUNTER — Other Ambulatory Visit: Payer: Self-pay

## 2023-06-28 VITALS — BP 128/78 | HR 66 | Ht 59.0 in | Wt 151.0 lb

## 2023-06-28 DIAGNOSIS — E876 Hypokalemia: Secondary | ICD-10-CM | POA: Diagnosis not present

## 2023-06-28 DIAGNOSIS — I10 Essential (primary) hypertension: Secondary | ICD-10-CM | POA: Diagnosis not present

## 2023-06-28 DIAGNOSIS — R7303 Prediabetes: Secondary | ICD-10-CM | POA: Diagnosis not present

## 2023-06-28 DIAGNOSIS — Z0000 Encounter for general adult medical examination without abnormal findings: Secondary | ICD-10-CM | POA: Diagnosis not present

## 2023-06-28 MED ORDER — HYDROCHLOROTHIAZIDE 25 MG PO TABS
25.0000 mg | ORAL_TABLET | Freq: Every day | ORAL | 1 refills | Status: DC
  Filled 2023-06-28 – 2023-08-11 (×2): qty 90, 90d supply, fill #0
  Filled 2023-11-15: qty 90, 90d supply, fill #1

## 2023-06-28 MED ORDER — POTASSIUM CHLORIDE CRYS ER 20 MEQ PO TBCR
20.0000 meq | EXTENDED_RELEASE_TABLET | Freq: Every day | ORAL | 1 refills | Status: DC
  Filled 2023-06-28 – 2023-08-11 (×2): qty 90, 90d supply, fill #0
  Filled 2023-11-15: qty 90, 90d supply, fill #1

## 2023-06-28 NOTE — Progress Notes (Signed)
Date:  06/28/2023   Name:  Caitlin Dean   DOB:  13-Oct-1961   MRN:  [id number]   Chief Complaint: Annual Exam, Hypertension, and Prediabetes  Patient is a 62 year old female who presents for a comprehensive physical exam. The patient reports the following problems: htn. Health maintenance has been reviewed up to date.    Hypertension This is a chronic problem. The current episode started more than 1 year ago. The problem has been gradually improving since onset. The problem is controlled. Pertinent negatives include no chest pain, headaches, neck pain, orthopnea, palpitations, peripheral edema or shortness of breath. There are no associated agents to hypertension. Risk factors for coronary artery disease include dyslipidemia. Past treatments include diuretics. The current treatment provides moderate improvement. Compliance problems include exercise.     Lab Results  Component Value Date   NA 137 12/31/2022   K 3.9 12/31/2022   CO2 29 12/31/2022   GLUCOSE 101 (H) 12/31/2022   BUN 17 12/31/2022   CREATININE 1.00 12/31/2022   CALCIUM 8.8 (L) 12/31/2022   GFRNONAA >60 12/31/2022   Lab Results  Component Value Date   CHOL 207 (H) 12/31/2022   HDL 66 12/31/2022   LDLCALC 127 (H) 12/31/2022   TRIG 69 12/31/2022   CHOLHDL 3.1 12/31/2022   No results found for: "TSH" Lab Results  Component Value Date   HGBA1C 5.9 (H) 08/14/2022   Lab Results  Component Value Date   WBC 4.0 06/19/2020   HGB 13.3 06/19/2020   HCT 40.1 06/19/2020   MCV 88 06/19/2020   PLT 206 06/19/2020   Lab Results  Component Value Date   ALT 20 12/31/2022   AST 24 12/31/2022   ALKPHOS 50 12/31/2022   BILITOT 0.6 12/31/2022   No results found for: "25OHVITD2", "25OHVITD3", "VD25OH"   Review of Systems  Constitutional:  Negative for unexpected weight change.  Respiratory:  Negative for cough and shortness of breath.   Cardiovascular:  Negative for chest pain, palpitations and orthopnea.   Gastrointestinal:  Negative for abdominal distention, abdominal pain, blood in stool, constipation, diarrhea, nausea and vomiting.  Genitourinary:  Negative for difficulty urinating.  Musculoskeletal:  Negative for neck pain.  Neurological:  Negative for headaches.    Patient Active Problem List   Diagnosis Date Noted   Chills 09/18/2022   Status post total hysterectomy 02/12/2021   Special screening for malignant neoplasms, colon    Essential hypertension 07/17/2015   Routine history and physical examination of adult 07/02/2015   Prediabetes 04/12/2012   Allergic rhinitis 03/19/2012   Middle ear effusion 03/19/2012    Allergies  Allergen Reactions   Sulfa Antibiotics Rash    Past Surgical History:  Procedure Laterality Date   ABDOMINAL HYSTERECTOMY     COLONOSCOPY WITH PROPOFOL N/A 07/11/2020   Procedure: COLONOSCOPY WITH PROPOFOL;  Surgeon: Midge Minium, MD;  Location: [facility];  Service: Endoscopy;  Laterality: N/A;  priority 4    Social History   Tobacco Use   Smoking status: Never   Smokeless tobacco: Never  Vaping Use   Vaping status: Never Used  Substance Use Topics   Alcohol use: Not Currently   Drug use: Never     Medication list has been reviewed and updated.  Current Meds  Medication Sig   hydrochlorothiazide (HYDRODIURIL) 25 MG tablet Take 1 tablet (25 mg total) by mouth daily.   Multiple Vitamin (MULTIVITAMIN) tablet Take 1 tablet by mouth daily.   Omega-3 Fatty Acids (  FISH OIL PO) Take by mouth daily.   potassium chloride SA (KLOR-CON M) 20 MEQ tablet Take 1 tablet (20 mEq total) by mouth daily.       06/28/2023   10:06 AM 12/31/2022    8:33 AM 08/04/2022    3:30 PM 07/23/2021   10:17 AM  GAD 7 : Generalized Anxiety Score  Nervous, Anxious, on Edge 0 0 0 0  Control/stop worrying 0 0 0 0  Worry too much - different things 0 0 0 0  Trouble relaxing 0 0 0 0  Restless 0 0 0 0  Easily annoyed or irritable 0 0 0 0  Afraid - awful might  happen 0 0 0 0  Total GAD 7 Score 0 0 0 0  Anxiety Difficulty Not difficult at all Not difficult at all Not difficult at all        06/28/2023   10:05 AM 12/31/2022    8:33 AM 08/04/2022    3:30 PM  Depression screen PHQ 2/9  Decreased Interest 0 0 0  Down, Depressed, Hopeless 0 0 0  PHQ - 2 Score 0 0 0  Altered sleeping 0 0 0  Tired, decreased energy 0 0 0  Change in appetite 0 0 0  Feeling bad or failure about yourself  0 0 0  Trouble concentrating 0 0 0  Moving slowly or fidgety/restless 0 0 0  Suicidal thoughts 0 0 0  PHQ-9 Score 0 0 0  Difficult doing work/chores Not difficult at all Not difficult at all Not difficult at all    BP Readings from Last 3 Encounters:  06/28/23 128/78  12/31/22 128/78  09/18/22 123/81    Physical Exam Vitals and nursing note reviewed. Exam conducted with a chaperone present.  Constitutional:      General: She is not in acute distress.    Appearance: She is not diaphoretic.  HENT:     Head: Normocephalic and atraumatic.     Jaw: There is normal jaw occlusion.     Right Ear: Tympanic membrane, ear canal and external ear normal.     Left Ear: Tympanic membrane, ear canal and external ear normal.     Nose: Nose normal. No congestion or rhinorrhea.     Right Turbinates: Not enlarged or swollen.     Left Turbinates: Not enlarged or swollen.     Mouth/Throat:     Lips: Pink.     Mouth: Mucous membranes are moist.     Tongue: No lesions.     Palate: No mass.     Pharynx: Oropharynx is clear. No oropharyngeal exudate, posterior oropharyngeal erythema, uvula swelling or postnasal drip.  Eyes:     General: Lids are normal. Vision grossly intact. Gaze aligned appropriately.        Right eye: No discharge.        Left eye: No discharge.     Conjunctiva/sclera: Conjunctivae normal.     Pupils: Pupils are equal, round, and reactive to light.  Neck:     Thyroid: No thyroid mass, thyromegaly or thyroid tenderness.     Vascular: No carotid bruit,  hepatojugular reflux or JVD.     Trachea: Trachea and phonation normal.  Cardiovascular:     Rate and Rhythm: Normal rate and regular rhythm.     Heart sounds: Normal heart sounds, S1 normal and S2 normal. No murmur heard.    No systolic murmur is present.     No diastolic murmur is present.  No friction rub. No gallop. No S3 or S4 sounds.  Pulmonary:     Effort: Pulmonary effort is normal.     Breath sounds: Normal breath sounds. No decreased breath sounds, wheezing, rhonchi or rales.  Abdominal:     General: Bowel sounds are normal.     Palpations: Abdomen is soft. There is no shifting dullness, hepatomegaly, splenomegaly or mass.     Tenderness: There is no abdominal tenderness. There is no right CVA tenderness, left CVA tenderness or guarding.     Hernia: No hernia is present.  Genitourinary:    Rectum: Normal. Guaiac result negative. No mass.  Musculoskeletal:        General: Normal range of motion.     Cervical back: Full passive range of motion without pain, normal range of motion and neck supple.     Right lower leg: 1+ Pitting Edema present.     Left lower leg: 1+ Pitting Edema present.  Lymphadenopathy:     Head:     Right side of head: No submental, submandibular or tonsillar adenopathy.     Left side of head: No submental, submandibular or tonsillar adenopathy.     Cervical: No cervical adenopathy.     Right cervical: No superficial, deep or posterior cervical adenopathy.    Left cervical: No superficial, deep or posterior cervical adenopathy.  Skin:    General: Skin is warm and dry.     Capillary Refill: Capillary refill takes less than 2 seconds.  Neurological:     Mental Status: She is alert.     Cranial Nerves: Cranial nerves 2-12 are intact.     Sensory: Sensation is intact.     Motor: Motor function is intact.     Deep Tendon Reflexes: Reflexes are normal and symmetric.  Psychiatric:        Behavior: Behavior is cooperative.     Wt Readings from Last 3  Encounters:  06/28/23 151 lb (68.5 kg)  12/31/22 156 lb (70.8 kg)  09/18/22 159 lb (72.1 kg)    BP 128/78   Pulse 66   Ht 4\' 11"  (1.499 m)   Wt 151 lb (68.5 kg)   SpO2 96%   BMI 30.50 kg/m   Assessment and Plan:  NAVEH CRUMRINE is a 62 y.o. female who presents today for her Complete Annual Exam. She feels well. She reports exercising some. She reports she is sleeping well.  Immunizations are reviewed and recommendations provided.   Age appropriate screening tests are discussed. Counseling given for risk factor reduction interventions.  1. Annual physical exam Notes subjective/objective concerns noted during HPI, review of past medical history and medications, review of systems, and physical exam.  Will check lipid panel and renal function panel for current status. - Lipid Panel With LDL/HDL Ratio - Renal Function Panel  2. Essential hypertension Chronic.  Controlled.  Stable.  Blood pressure today is 128/78.  Continue hydrochlorothiazide 25 mg once a day.  Will check renal function panel for electrolytes and GFR. - hydrochlorothiazide (HYDRODIURIL) 25 MG tablet; Take 1 tablet (25 mg total) by mouth daily.  Dispense: 90 tablet; Refill: 1 - Renal Function Panel  3. Hypokalemia Chronic.  Patient is currently on potassium chloride 20 mg daily. - potassium chloride SA (KLOR-CON M) 20 MEQ tablet; Take 1 tablet (20 mEq total) by mouth daily.  Dispense: 90 tablet; Refill: 1  4. Prediabetes Chronic.  Controlled.  Stable.  Patient is controlled prediabetic and we will check A1c for current  status of control. - HgB A1c   Elizabeth Sauer, MD

## 2023-07-01 DIAGNOSIS — I10 Essential (primary) hypertension: Secondary | ICD-10-CM | POA: Diagnosis not present

## 2023-07-01 DIAGNOSIS — R7303 Prediabetes: Secondary | ICD-10-CM | POA: Diagnosis not present

## 2023-07-01 DIAGNOSIS — Z0000 Encounter for general adult medical examination without abnormal findings: Secondary | ICD-10-CM | POA: Diagnosis not present

## 2023-07-02 ENCOUNTER — Encounter: Payer: Self-pay | Admitting: Family Medicine

## 2023-07-02 LAB — RENAL FUNCTION PANEL
Albumin: 4.3 g/dL (ref 3.9–4.9)
BUN/Creatinine Ratio: 16 (ref 12–28)
BUN: 16 mg/dL (ref 8–27)
CO2: 26 mmol/L (ref 20–29)
Calcium: 9.5 mg/dL (ref 8.7–10.3)
Chloride: 103 mmol/L (ref 96–106)
Creatinine, Ser: 0.99 mg/dL (ref 0.57–1.00)
Glucose: 83 mg/dL (ref 70–99)
Phosphorus: 3.7 mg/dL (ref 3.0–4.3)
Potassium: 3.5 mmol/L (ref 3.5–5.2)
Sodium: 144 mmol/L (ref 134–144)
eGFR: 65 mL/min/{1.73_m2} (ref >59)

## 2023-07-02 LAB — HEMOGLOBIN A1C
Est. average glucose Bld gHb Est-mCnc: 126 mg/dL
Hgb A1c MFr Bld: 6 % — ABNORMAL HIGH (ref 4.8–5.6)

## 2023-07-02 LAB — LIPID PANEL WITH LDL/HDL RATIO
Cholesterol, Total: 201 mg/dL — ABNORMAL HIGH (ref 100–199)
HDL: 65 mg/dL (ref >39)
LDL Chol Calc (NIH): 125 mg/dL — ABNORMAL HIGH (ref 0–99)
LDL/HDL Ratio: 1.9 ratio (ref 0.0–3.2)
Triglycerides: 61 mg/dL (ref 0–149)
VLDL Cholesterol Cal: 11 mg/dL (ref 5–40)

## 2023-07-06 ENCOUNTER — Ambulatory Visit (INDEPENDENT_AMBULATORY_CARE_PROVIDER_SITE_OTHER): Payer: 59 | Admitting: Family Medicine

## 2023-07-06 ENCOUNTER — Other Ambulatory Visit
Admission: RE | Admit: 2023-07-06 | Discharge: 2023-07-06 | Disposition: A | Discharge status: Home / Self Care | Payer: 59 | Attending: Family Medicine | Admitting: Family Medicine

## 2023-07-06 ENCOUNTER — Other Ambulatory Visit: Payer: Self-pay

## 2023-07-06 ENCOUNTER — Encounter: Payer: Self-pay | Admitting: Family Medicine

## 2023-07-06 VITALS — BP 110/76 | HR 65 | Ht 59.0 in | Wt 150.0 lb

## 2023-07-06 DIAGNOSIS — G5603 Carpal tunnel syndrome, bilateral upper limbs: Secondary | ICD-10-CM

## 2023-07-06 DIAGNOSIS — R1011 Right upper quadrant pain: Secondary | ICD-10-CM | POA: Diagnosis not present

## 2023-07-06 LAB — HEPATIC FUNCTION PANEL
ALT: 22 U/L (ref 0–44)
AST: 24 U/L (ref 15–41)
Albumin: 4 g/dL (ref 3.5–5.0)
Alkaline Phosphatase: 49 U/L (ref 38–126)
Bilirubin, Direct: 0.1 mg/dL (ref 0.0–0.2)
Indirect Bilirubin: 0.2 mg/dL — ABNORMAL LOW (ref 0.3–0.9)
Total Bilirubin: 0.3 mg/dL (ref 0.3–1.2)
Total Protein: 7.1 g/dL (ref 6.5–8.1)

## 2023-07-06 LAB — CBC WITH DIFFERENTIAL/PLATELET
Abs Immature Granulocytes: 0.01 10*3/uL (ref 0.00–0.07)
Basophils Absolute: 0 10*3/uL (ref 0.0–0.1)
Basophils Relative: 1 %
Eosinophils Absolute: 0 10*3/uL (ref 0.0–0.5)
Eosinophils Relative: 1 %
HCT: 38.3 % (ref 36.0–46.0)
Hemoglobin: 12.8 g/dL (ref 12.0–15.0)
Immature Granulocytes: 0 %
Lymphocytes Relative: 41 %
Lymphs Abs: 1.6 10*3/uL (ref 0.7–4.0)
MCH: 29.5 pg (ref 26.0–34.0)
MCHC: 33.4 g/dL (ref 30.0–36.0)
MCV: 88.2 fL (ref 80.0–100.0)
Monocytes Absolute: 0.3 10*3/uL (ref 0.1–1.0)
Monocytes Relative: 7 %
Neutro Abs: 2.1 10*3/uL (ref 1.7–7.7)
Neutrophils Relative %: 50 %
Platelets: 203 10*3/uL (ref 150–400)
RBC: 4.34 MIL/uL (ref 3.87–5.11)
RDW: 13.6 % (ref 11.5–15.5)
WBC: 4.1 10*3/uL (ref 4.0–10.5)
nRBC: 0 % (ref 0.0–0.2)

## 2023-07-06 LAB — LIPASE, BLOOD: Lipase: 35 U/L (ref 11–51)

## 2023-07-06 MED ORDER — PANTOPRAZOLE SODIUM 40 MG PO TBEC
40.0000 mg | DELAYED_RELEASE_TABLET | Freq: Every day | ORAL | 3 refills | Status: DC
  Filled 2023-07-06: qty 90, 90d supply, fill #0
  Filled 2023-10-06: qty 30, 30d supply, fill #1

## 2023-07-06 NOTE — Progress Notes (Signed)
Date:  07/06/2023   Name:  Caitlin Dean   DOB:  1961-03-17   MRN:  [id number]   Chief Complaint: Abdominal Pain (RUQ pain off and on- if eats "a lot it stays there and is uncomfortable")  Abdominal Pain This is a new problem. The current episode started more than 1 month ago (6 mos). The problem occurs intermittently. The pain is located in the RUQ. The pain is moderate. The quality of the pain is colicky. The abdominal pain does not radiate. Pertinent negatives include no anorexia, constipation, diarrhea, dysuria, hematochezia, hematuria, melena or nausea. The pain is relieved by Being still. She has tried nothing for the symptoms. There is no history of abdominal surgery, irritable bowel syndrome or PUD.  Neurologic Problem The patient's pertinent negatives include no weakness. Primary symptoms comment: medial paresthesias. This is a recurrent problem. The current episode started more than 1 month ago. The problem has been waxing and waning since onset. There was upper extremity focality noted. Associated symptoms include abdominal pain. Pertinent negatives include no nausea. Past treatments include nothing.    Lab Results  Component Value Date   NA 144 07/01/2023   K 3.5 07/01/2023   CO2 26 07/01/2023   GLUCOSE 83 07/01/2023   BUN 16 07/01/2023   CREATININE 0.99 07/01/2023   CALCIUM 9.5 07/01/2023   EGFR 65 07/01/2023   GFRNONAA >60 12/31/2022   Lab Results  Component Value Date   CHOL 201 (H) 07/01/2023   HDL 65 07/01/2023   LDLCALC 125 (H) 07/01/2023   TRIG 61 07/01/2023   CHOLHDL 3.1 12/31/2022   No results found for: "TSH" Lab Results  Component Value Date   HGBA1C 6.0 (H) 07/01/2023   Lab Results  Component Value Date   WBC 4.0 06/19/2020   HGB 13.3 06/19/2020   HCT 40.1 06/19/2020   MCV 88 06/19/2020   PLT 206 06/19/2020   Lab Results  Component Value Date   ALT 20 12/31/2022   AST 24 12/31/2022   ALKPHOS 50 12/31/2022   BILITOT 0.6 12/31/2022    No results found for: "25OHVITD2", "25OHVITD3", "VD25OH"   Review of Systems  Gastrointestinal:  Positive for abdominal pain. Negative for anorexia, constipation, diarrhea, hematochezia, melena and nausea.  Genitourinary:  Negative for dysuria and hematuria.  Neurological:  Negative for weakness.    Patient Active Problem List   Diagnosis Date Noted   Chills 09/18/2022   Status post total hysterectomy 02/12/2021   Special screening for malignant neoplasms, colon    Essential hypertension 07/17/2015   Routine history and physical examination of adult 07/02/2015   Prediabetes 04/12/2012   Allergic rhinitis 03/19/2012   Middle ear effusion 03/19/2012    Allergies  Allergen Reactions   Sulfa Antibiotics Rash    Past Surgical History:  Procedure Laterality Date   ABDOMINAL HYSTERECTOMY     COLONOSCOPY WITH PROPOFOL N/A 07/11/2020   Procedure: COLONOSCOPY WITH PROPOFOL;  Surgeon: Midge Minium, MD;  Location: [facility];  Service: Endoscopy;  Laterality: N/A;  priority 4    Social History   Tobacco Use   Smoking status: Never   Smokeless tobacco: Never  Vaping Use   Vaping status: Never Used  Substance Use Topics   Alcohol use: Not Currently   Drug use: Never     Medication list has been reviewed and updated.  No outpatient medications have been marked as taking for the 07/06/23 encounter (Office Visit) with Duanne Limerick, MD.  07/06/2023    8:45 AM 06/28/2023   10:06 AM 12/31/2022    8:33 AM 08/04/2022    3:30 PM  GAD 7 : Generalized Anxiety Score  Nervous, Anxious, on Edge 0 0 0 0  Control/stop worrying 0 0 0 0  Worry too much - different things 0 0 0 0  Trouble relaxing 0 0 0 0  Restless 0 0 0 0  Easily annoyed or irritable 0 0 0 0  Afraid - awful might happen 0 0 0 0  Total GAD 7 Score 0 0 0 0  Anxiety Difficulty Not difficult at all Not difficult at all Not difficult at all Not difficult at all       07/06/2023    8:45 AM 06/28/2023   10:05  AM 12/31/2022    8:33 AM  Depression screen PHQ 2/9  Decreased Interest 0 0 0  Down, Depressed, Hopeless 0 0 0  PHQ - 2 Score 0 0 0  Altered sleeping 0 0 0  Tired, decreased energy 0 0 0  Change in appetite 0 0 0  Feeling bad or failure about yourself  0 0 0  Trouble concentrating 0 0 0  Moving slowly or fidgety/restless 0 0 0  Suicidal thoughts 0 0 0  PHQ-9 Score 0 0 0  Difficult doing work/chores Not difficult at all Not difficult at all Not difficult at all    BP Readings from Last 3 Encounters:  07/06/23 110/76  06/28/23 128/78  12/31/22 128/78    Physical Exam Vitals and nursing note reviewed.  Constitutional:      Appearance: She is well-developed.  HENT:     Head: Normocephalic.     Right Ear: External ear normal.     Left Ear: External ear normal.  Eyes:     General: Lids are everted, no foreign bodies appreciated. No scleral icterus.       Left eye: No foreign body or hordeolum.     Conjunctiva/sclera: Conjunctivae normal.     Right eye: Right conjunctiva is not injected.     Left eye: Left conjunctiva is not injected.     Pupils: Pupils are equal, round, and reactive to light.  Neck:     Thyroid: No thyromegaly.     Vascular: No JVD.     Trachea: No tracheal deviation.  Cardiovascular:     Rate and Rhythm: Normal rate and regular rhythm.     Heart sounds: Normal heart sounds. No murmur heard.    No friction rub. No gallop.  Pulmonary:     Effort: Pulmonary effort is normal. No respiratory distress.     Breath sounds: Normal breath sounds. No wheezing, rhonchi or rales.  Abdominal:     General: Bowel sounds are normal.     Palpations: Abdomen is soft. There is no hepatomegaly, splenomegaly or mass.     Tenderness: There is abdominal tenderness in the epigastric area. There is no guarding or rebound.  Musculoskeletal:        General: No tenderness. Normal range of motion.     Right wrist: No tenderness. Normal pulse.     Left wrist: No tenderness. Normal  pulse.     Cervical back: Normal range of motion and neck supple.     Comments: Negative Tinel/Phelen  Lymphadenopathy:     Cervical: No cervical adenopathy.  Skin:    General: Skin is warm.     Findings: No rash.  Neurological:     Mental Status: She  is alert and oriented to person, place, and time.     Cranial Nerves: No cranial nerve deficit.     Deep Tendon Reflexes: Reflexes normal.  Psychiatric:        Mood and Affect: Mood is not anxious or depressed.     Wt Readings from Last 3 Encounters:  07/06/23 150 lb (68 kg)  06/28/23 151 lb (68.5 kg)  12/31/22 156 lb (70.8 kg)    BP 110/76   Pulse 65   Ht 4\' 11"  (1.499 m)   Wt 150 lb (68 kg)   SpO2 97%   BMI 30.30 kg/m   Assessment and Plan: 1. Right upper quadrant abdominal pain New onset.  Within the last 6 months has developed pain in the right upper quadrant which is exacerbated with food and goes mostly to the mid epigastric area.  This is colicky in nature and resolves after 5 minutes.  We will check hepatic panel lipase and CBC for liver or pancreatic concerns.  We will initiate pantoprazole 40 mg once a day to see if reduction of acid in case there is an ulcer possibility.  And will check a ultrasound of the right upper quadrant to evaluate gallbladder and liver concerns.  Will recheck after reevaluation of labs if needed. - US Abdomen Limited RUQ (LIVER/GB) - Hepatic Function Panel (6) - Lipase - CBC with Differential/Platelet - pantoprazole (PROTONIX) 40 MG tablet; Take 1 tablet (40 mg total) by mouth daily.  Dispense: 30 tablet; Refill: 3  2. Bilateral carpal tunnel syndrome New onset.  Patient awakes with "tingling "in the fingers of either hand.  History is suggestive of carpal tunnel even though Phalen and Tinel sign is negative.  I have suggested her getting a splint to stabilize and to keep in neutral position while sleeping the most involved wrist.  In the meantime patient has been told that at mealtime she  could take an anti-inflammatory of choice to reduce the inflammation as needed.    Elizabeth Sauer, MD

## 2023-07-07 ENCOUNTER — Encounter: Payer: Self-pay | Admitting: Family Medicine

## 2023-07-09 ENCOUNTER — Ambulatory Visit: Payer: 59

## 2023-07-13 ENCOUNTER — Ambulatory Visit
Admission: RE | Admit: 2023-07-13 | Discharge: 2023-07-13 | Disposition: A | Discharge status: Home / Self Care | Payer: 59 | Source: Ambulatory Visit | Attending: Family Medicine | Admitting: Family Medicine

## 2023-07-13 DIAGNOSIS — R1011 Right upper quadrant pain: Secondary | ICD-10-CM | POA: Insufficient documentation

## 2023-07-19 ENCOUNTER — Other Ambulatory Visit: Payer: Self-pay | Admitting: Family Medicine

## 2023-07-19 DIAGNOSIS — Z1231 Encounter for screening mammogram for malignant neoplasm of breast: Secondary | ICD-10-CM

## 2023-07-20 ENCOUNTER — Other Ambulatory Visit (HOSPITAL_BASED_OUTPATIENT_CLINIC_OR_DEPARTMENT_OTHER): Payer: Self-pay

## 2023-07-20 MED ORDER — COMIRNATY 30 MCG/0.3ML IM SUSY
PREFILLED_SYRINGE | INTRAMUSCULAR | 0 refills | Status: DC
  Filled 2023-07-20: qty 0.3, 1d supply, fill #0

## 2023-07-20 MED ORDER — FLULAVAL 0.5 ML IM SUSY
PREFILLED_SYRINGE | INTRAMUSCULAR | 0 refills | Status: DC
  Filled 2023-07-20: qty 0.5, 1d supply, fill #0

## 2023-07-29 ENCOUNTER — Ambulatory Visit (INDEPENDENT_AMBULATORY_CARE_PROVIDER_SITE_OTHER): Payer: 59 | Admitting: Family Medicine

## 2023-07-29 ENCOUNTER — Encounter: Payer: Self-pay | Admitting: Family Medicine

## 2023-07-29 VITALS — BP 122/78 | HR 68 | Ht 59.0 in | Wt 150.0 lb

## 2023-07-29 DIAGNOSIS — R102 Pelvic and perineal pain: Secondary | ICD-10-CM

## 2023-07-29 DIAGNOSIS — R1084 Generalized abdominal pain: Secondary | ICD-10-CM | POA: Diagnosis not present

## 2023-07-29 NOTE — Progress Notes (Signed)
Date:  07/29/2023   Name:  Caitlin Dean   DOB:  [birth year]   MRN:  [id number]   Chief Complaint: Abdominal Pain (Follow up R) lower quadrant pain- US liver and gallbladder were normal)  Abdominal Pain This is a new problem. The current episode started more than 1 month ago. The onset quality is gradual. The problem occurs every several days. The problem has been unchanged. The pain is located in the RLQ. The pain is moderate. The quality of the pain is aching. The abdominal pain does not radiate. Pertinent negatives include no constipation, diarrhea, nausea or vomiting. Nothing aggravates the pain. The pain is relieved by Nothing. Treatments tried: pantoprazole. Prior workup: ultrasound ruq.    Lab Results  Component Value Date   NA 144 07/01/2023   K 3.5 07/01/2023   CO2 26 07/01/2023   GLUCOSE 83 07/01/2023   BUN 16 07/01/2023   CREATININE 0.99 07/01/2023   CALCIUM 9.5 07/01/2023   EGFR 65 07/01/2023   GFRNONAA >60 12/31/2022   Lab Results  Component Value Date   CHOL 201 (H) 07/01/2023   HDL 65 07/01/2023   LDLCALC 125 (H) 07/01/2023   TRIG 61 07/01/2023   CHOLHDL 3.1 12/31/2022   No results found for: "TSH" Lab Results  Component Value Date   HGBA1C 6.0 (H) 07/01/2023   Lab Results  Component Value Date   WBC 4.1 07/06/2023   HGB 12.8 07/06/2023   HCT 38.3 07/06/2023   MCV 88.2 07/06/2023   PLT 203 07/06/2023   Lab Results  Component Value Date   ALT 22 07/06/2023   AST 24 07/06/2023   ALKPHOS 49 07/06/2023   BILITOT 0.3 07/06/2023   No results found for: "25OHVITD2", "25OHVITD3", "VD25OH"   Review of Systems  Respiratory:  Negative for shortness of breath, wheezing and stridor.   Cardiovascular:  Negative for chest pain and leg swelling.  Gastrointestinal:  Positive for abdominal pain. Negative for abdominal distention, anal bleeding, blood in stool, constipation, diarrhea, nausea, rectal pain and vomiting.    Patient Active Problem List    Diagnosis Date Noted   Chills 09/18/2022   Status post total hysterectomy 02/12/2021   Special screening for malignant neoplasms, colon    Essential hypertension 07/17/2015   Routine history and physical examination of adult 07/02/2015   Prediabetes 04/12/2012   Allergic rhinitis 03/19/2012   Middle ear effusion 03/19/2012    Allergies  Allergen Reactions   Sulfa Antibiotics Rash    Past Surgical History:  Procedure Laterality Date   ABDOMINAL HYSTERECTOMY     COLONOSCOPY WITH PROPOFOL N/A 07/11/2020   Procedure: COLONOSCOPY WITH PROPOFOL;  Surgeon: Midge Minium, MD;  Location: [facility];  Service: Endoscopy;  Laterality: N/A;  priority 4    Social History   Tobacco Use   Smoking status: Never   Smokeless tobacco: Never  Vaping Use   Vaping status: Never Used  Substance Use Topics   Alcohol use: Not Currently   Drug use: Never     Medication list has been reviewed and updated.  Current Meds  Medication Sig   hydrochlorothiazide (HYDRODIURIL) 25 MG tablet Take 1 tablet (25 mg total) by mouth daily.   Multiple Vitamin (MULTIVITAMIN) tablet Take 1 tablet by mouth daily.   Omega-3 Fatty Acids (FISH OIL PO) Take by mouth daily.   pantoprazole (PROTONIX) 40 MG tablet Take 1 tablet (40 mg total) by mouth daily.   potassium chloride SA (KLOR-CON M) 20 MEQ tablet  Take 1 tablet (20 mEq total) by mouth daily.       07/29/2023    1:57 PM 07/06/2023    8:45 AM 06/28/2023   10:06 AM 12/31/2022    8:33 AM  GAD 7 : Generalized Anxiety Score  Nervous, Anxious, on Edge 0 0 0 0  Control/stop worrying 0 0 0 0  Worry too much - different things 0 0 0 0  Trouble relaxing 0 0 0 0  Restless 0 0 0 0  Easily annoyed or irritable 0 0 0 0  Afraid - awful might happen 0 0 0 0  Total GAD 7 Score 0 0 0 0  Anxiety Difficulty Not difficult at all Not difficult at all Not difficult at all Not difficult at all       07/29/2023    1:57 PM 07/06/2023    8:45 AM 06/28/2023   10:05 AM   Depression screen PHQ 2/9  Decreased Interest 0 0 0  Down, Depressed, Hopeless 0 0 0  PHQ - 2 Score 0 0 0  Altered sleeping 0 0 0  Tired, decreased energy 0 0 0  Change in appetite 0 0 0  Feeling bad or failure about yourself  0 0 0  Trouble concentrating 0 0 0  Moving slowly or fidgety/restless 0 0 0  Suicidal thoughts 0 0 0  PHQ-9 Score 0 0 0  Difficult doing work/chores Not difficult at all Not difficult at all Not difficult at all    BP Readings from Last 3 Encounters:  07/29/23 122/78  07/06/23 110/76  06/28/23 128/78    Physical Exam Vitals and nursing note reviewed.  HENT:     Head: Normocephalic.     Mouth/Throat:     Mouth: Mucous membranes are moist.  Eyes:     Extraocular Movements: Extraocular movements intact.     Conjunctiva/sclera:     Right eye: Right conjunctiva is not injected.     Left eye: Left conjunctiva is not injected.  Cardiovascular:     Rate and Rhythm: Tachycardia present.     Heart sounds: Normal heart sounds and S1 normal. No murmur heard.    No systolic murmur is present.     No diastolic murmur is present.     No gallop.  Pulmonary:     Breath sounds: No transmitted upper airway sounds. No wheezing, rhonchi or rales.  Abdominal:     General: Abdomen is flat. Bowel sounds are normal.     Palpations: There is no hepatomegaly or splenomegaly.     Tenderness: There is no abdominal tenderness. There is no right CVA tenderness, left CVA tenderness, guarding or rebound. Negative signs include Murphy's sign and McBurney's sign.     Hernia: There is no hernia in the umbilical area or ventral area.       Comments: Negative carnett  Genitourinary:    Rectum: Normal. Guaiac result negative. No mass, tenderness or anal fissure.  Neurological:     Mental Status: She is alert.     Wt Readings from Last 3 Encounters:  07/29/23 150 lb (68 kg)  07/06/23 150 lb (68 kg)  06/28/23 151 lb (68.5 kg)    BP 122/78   Pulse 68   Ht 4\' 11"  (1.499  m)   Wt 150 lb (68 kg)   SpO2 99%   BMI 30.30 kg/m   Assessment and Plan: 1. Generalized abdominal pain New onset.  Persistent.  Originally thought the pain was more so localized  in patient still points to the right upper quadrant but now she says it also involves the right lower quadrant and across to the pelvic.  Abdomen is soft with minimal tenderness. - CBC with Differential/Platelet - US Abdomen Complete; normal - US Pelvic Complete With Transvaginal; normal - POCT Urinalysis Dipstick  2. Pelvic pain New onset.  Persistent.  Patient has had a hysterectomy but ovaries remain.  Patient states that in the pelvic area more so on the right than the left there is been discomfort for 3 months.  We will obtain an ultrasound of the pelvis with transvaginal and proceed with further evaluation after eval - CBC with Differential/Platelet - US Abdomen Complete; Future - US Pelvic Complete With Transvaginal; Future - POCT Urinalysis Dipstick     Elizabeth Sauer, MD

## 2023-07-30 ENCOUNTER — Other Ambulatory Visit: Payer: Self-pay | Admitting: Family Medicine

## 2023-07-30 ENCOUNTER — Encounter: Payer: Self-pay | Admitting: Family Medicine

## 2023-07-30 DIAGNOSIS — R1084 Generalized abdominal pain: Secondary | ICD-10-CM

## 2023-07-30 DIAGNOSIS — R102 Pelvic and perineal pain: Secondary | ICD-10-CM

## 2023-07-30 LAB — CBC WITH DIFFERENTIAL/PLATELET
Basophils Absolute: 0 10*3/uL (ref 0.0–0.2)
Basos: 1 %
EOS (ABSOLUTE): 0 10*3/uL (ref 0.0–0.4)
Eos: 1 %
Hematocrit: 47.2 % — ABNORMAL HIGH (ref 34.0–46.6)
Hemoglobin: 15.1 g/dL (ref 11.1–15.9)
Immature Grans (Abs): 0 10*3/uL (ref 0.0–0.1)
Immature Granulocytes: 0 %
Lymphocytes Absolute: 2.5 10*3/uL (ref 0.7–3.1)
Lymphs: 45 %
MCH: 28.6 pg (ref 26.6–33.0)
MCHC: 32 g/dL (ref 31.5–35.7)
MCV: 89 fL (ref 79–97)
Monocytes Absolute: 0.3 10*3/uL (ref 0.1–0.9)
Monocytes: 6 %
Neutrophils Absolute: 2.6 10*3/uL (ref 1.4–7.0)
Neutrophils: 47 %
Platelets: 282 10*3/uL (ref 150–450)
RBC: 5.28 x10E6/uL (ref 3.77–5.28)
RDW: 13.4 % (ref 11.7–15.4)
WBC: 5.5 10*3/uL (ref 3.4–10.8)

## 2023-08-02 ENCOUNTER — Ambulatory Visit: Payer: 59

## 2023-08-06 ENCOUNTER — Ambulatory Visit
Admission: RE | Admit: 2023-08-06 | Discharge: 2023-08-06 | Disposition: A | Discharge status: Home / Self Care | Payer: 59 | Source: Ambulatory Visit | Attending: Family Medicine | Admitting: Family Medicine

## 2023-08-06 DIAGNOSIS — R140 Abdominal distension (gaseous): Secondary | ICD-10-CM | POA: Diagnosis not present

## 2023-08-06 DIAGNOSIS — K5730 Diverticulosis of large intestine without perforation or abscess without bleeding: Secondary | ICD-10-CM | POA: Diagnosis not present

## 2023-08-06 DIAGNOSIS — R102 Pelvic and perineal pain: Secondary | ICD-10-CM | POA: Diagnosis not present

## 2023-08-06 DIAGNOSIS — R1084 Generalized abdominal pain: Secondary | ICD-10-CM | POA: Diagnosis not present

## 2023-08-06 DIAGNOSIS — K769 Liver disease, unspecified: Secondary | ICD-10-CM | POA: Diagnosis not present

## 2023-08-06 MED ORDER — IOHEXOL 300 MG/ML  SOLN
100.0000 mL | Freq: Once | INTRAMUSCULAR | Status: AC | PRN
  Administered 2023-08-06: 100 mL via INTRAVENOUS

## 2023-08-11 ENCOUNTER — Other Ambulatory Visit: Payer: Self-pay

## 2023-08-14 ENCOUNTER — Encounter: Payer: Self-pay | Admitting: Family Medicine

## 2023-09-06 ENCOUNTER — Ambulatory Visit
Admission: RE | Admit: 2023-09-06 | Discharge: 2023-09-06 | Disposition: A | Discharge status: Home / Self Care | Payer: 59 | Source: Ambulatory Visit | Attending: Family Medicine | Admitting: Family Medicine

## 2023-09-06 DIAGNOSIS — Z1231 Encounter for screening mammogram for malignant neoplasm of breast: Secondary | ICD-10-CM | POA: Insufficient documentation

## 2023-10-06 ENCOUNTER — Other Ambulatory Visit: Payer: Self-pay

## 2023-10-18 ENCOUNTER — Other Ambulatory Visit: Payer: Self-pay

## 2023-11-15 ENCOUNTER — Other Ambulatory Visit: Payer: Self-pay

## 2023-11-15 ENCOUNTER — Ambulatory Visit: Payer: Self-pay

## 2023-11-15 ENCOUNTER — Ambulatory Visit: Payer: Commercial Managed Care - PPO | Admitting: Family Medicine

## 2023-11-15 VITALS — BP 124/70 | HR 92 | Ht 59.0 in | Wt 159.0 lb

## 2023-11-15 DIAGNOSIS — G629 Polyneuropathy, unspecified: Secondary | ICD-10-CM

## 2023-11-15 NOTE — Telephone Encounter (Signed)
 Chief Complaint: tingling in hands/feet mainly at night Symptoms: onset x 1 month Frequency: every night for tingling hands, off and on for feet/legs Pertinent Negatives: Patient denies other symptoms Disposition: [] ED /[] Urgent Care (no appt availability in office) / [x] Appointment(In office/virtual)/ []  [place] [facility]/ [] Home Care/ [] Refused Recommended Disposition /[] [facility]/ []  Follow-up with PCP Additional Notes: Patient says the tingling in hands is occurring every night, but legs and feet off and on at night, sometimes during the day tingling in hands. Advised OV, scheduled today.   Summary: tingling in hands in feet   Pt has tingling in hands and feet at night      Reason for Disposition  [1] Numbness or tingling on both sides of body AND [2] is a new symptom present > 24 hours  Answer Assessment - Initial Assessment Questions 1. SYMPTOM: What is the main symptom you are concerned about? (e.g., weakness, numbness)     Tingling in hands (more) and feet more at night (wakes up), some during the day 2. ONSET: When did this start? (minutes, hours, days; while sleeping)     1 month ago 3. LAST NORMAL: When was the last time you (the patient) were normal (no symptoms)?     Prior to 1 month ago 4. PATTERN Does this come and go, or has it been constant since it started?  Is it present now?     Come and go, but has been happening every night in the hands, legs/feet sometimes 5. CARDIAC SYMPTOMS: Have you had any of the following symptoms: chest pain, difficulty breathing, palpitations?     No 6. NEUROLOGIC SYMPTOMS: Have you had any of the following symptoms: headache, dizziness, vision loss, double vision, changes in speech, unsteady on your feet?     No 7. OTHER SYMPTOMS: Do you have any other symptoms?     No  Protocols used: Neurologic Deficit-A-AH

## 2023-11-15 NOTE — Progress Notes (Signed)
 Date:  11/15/2023   Name:  Caitlin Dean   DOB:  10/10/1961   MRN:  [id number]   Chief Complaint: Tingling (Pt is experiencing tingling in hands and feet since mid December 2024. Pt state she also had an issue with sciatic nerve over the summer of 2024. She feels maybe that's where the tingling could be coming from.)  Neurologic Problem The patient's primary symptoms include focal sensory loss. The patient's pertinent negatives include no altered mental status, clumsiness, focal weakness, loss of balance, memory loss, near-syncope, slurred speech, syncope, visual change or weakness. This is a new problem. The current episode started more than 1 month ago (over the summer). There was upper extremity, left-sided and right-sided focality noted. Pertinent negatives include no abdominal pain, bladder incontinence, bowel incontinence, dizziness, nausea, neck pain, palpitations or shortness of breath. The treatment provided mild relief. There is no history of a clotting disorder, a CVA, head trauma or seizures.    Lab Results  Component Value Date   NA 144 07/01/2023   K 3.5 07/01/2023   CO2 26 07/01/2023   GLUCOSE 83 07/01/2023   BUN 16 07/01/2023   CREATININE 0.99 07/01/2023   CALCIUM 9.5 07/01/2023   EGFR 65 07/01/2023   GFRNONAA >60 12/31/2022   Lab Results  Component Value Date   CHOL 201 (H) 07/01/2023   HDL 65 07/01/2023   LDLCALC 125 (H) 07/01/2023   TRIG 61 07/01/2023   CHOLHDL 3.1 12/31/2022   No results found for: TSH Lab Results  Component Value Date   HGBA1C 6.0 (H) 07/01/2023   Lab Results  Component Value Date   WBC 5.5 07/29/2023   HGB 15.1 07/29/2023   HCT 47.2 (H) 07/29/2023   MCV 89 07/29/2023   PLT 282 07/29/2023   Lab Results  Component Value Date   ALT 22 07/06/2023   AST 24 07/06/2023   ALKPHOS 49 07/06/2023   BILITOT 0.3 07/06/2023   No results found for: 25OHVITD2, 25OHVITD3, VD25OH   Review of Systems  Respiratory:  Negative for  cough, chest tightness, shortness of breath, wheezing and stridor.   Cardiovascular:  Negative for palpitations and near-syncope.  Gastrointestinal:  Negative for abdominal pain, bowel incontinence and nausea.  Genitourinary:  Negative for bladder incontinence.  Musculoskeletal:  Negative for neck pain.  Neurological:  Negative for dizziness, focal weakness, syncope, weakness and loss of balance.  Psychiatric/Behavioral:  Negative for memory loss.     Patient Active Problem List   Diagnosis Date Noted   Chills 09/18/2022   Status post total hysterectomy 02/12/2021   Special screening for malignant neoplasms, colon    Essential hypertension 07/17/2015   Routine history and physical examination of adult 07/02/2015   Prediabetes 04/12/2012   Allergic rhinitis 03/19/2012   Middle ear effusion 03/19/2012    Allergies  Allergen Reactions   Sulfa Antibiotics Rash    Past Surgical History:  Procedure Laterality Date   ABDOMINAL HYSTERECTOMY     COLONOSCOPY WITH PROPOFOL  N/A 07/11/2020   Procedure: COLONOSCOPY WITH PROPOFOL ;  Surgeon: Jinny Carmine, MD;  Location: [facility];  Service: Endoscopy;  Laterality: N/A;  priority 4    Social History   Tobacco Use   Smoking status: Never   Smokeless tobacco: Never  Vaping Use   Vaping status: Never Used  Substance Use Topics   Alcohol use: Not Currently   Drug use: Never     Medication list has been reviewed and updated.  Current Meds  Medication Sig   hydrochlorothiazide  (HYDRODIURIL ) 25 MG tablet Take 1 tablet (25 mg total) by mouth daily.   Multiple Vitamin (MULTIVITAMIN) tablet Take 1 tablet by mouth daily.   Omega-3 Fatty Acids (FISH OIL PO) Take by mouth daily.   pantoprazole  (PROTONIX ) 40 MG tablet Take 1 tablet (40 mg total) by mouth daily.   potassium chloride  SA (KLOR-CON  M) 20 MEQ tablet Take 1 tablet (20 mEq total) by mouth daily.       11/15/2023    2:44 PM 07/29/2023    1:57 PM 07/06/2023    8:45 AM  06/28/2023   10:06 AM  GAD 7 : Generalized Anxiety Score  Nervous, Anxious, on Edge 0 0 0 0  Control/stop worrying 0 0 0 0  Worry too much - different things 0 0 0 0  Trouble relaxing 0 0 0 0  Restless 0 0 0 0  Easily annoyed or irritable 0 0 0 0  Afraid - awful might happen 0 0 0 0  Total GAD 7 Score 0 0 0 0  Anxiety Difficulty Not difficult at all Not difficult at all Not difficult at all Not difficult at all       11/15/2023    2:44 PM 07/29/2023    1:57 PM 07/06/2023    8:45 AM  Depression screen PHQ 2/9  Decreased Interest 0 0 0  Down, Depressed, Hopeless 0 0 0  PHQ - 2 Score 0 0 0  Altered sleeping 0 0 0  Tired, decreased energy 0 0 0  Change in appetite 0 0 0  Feeling bad or failure about yourself  0 0 0  Trouble concentrating 0 0 0  Moving slowly or fidgety/restless 0 0 0  Suicidal thoughts 0 0 0  PHQ-9 Score 0 0 0  Difficult doing work/chores Not difficult at all Not difficult at all Not difficult at all    BP Readings from Last 3 Encounters:  11/15/23 124/70  07/29/23 122/78  07/06/23 110/76    Physical Exam Vitals and nursing note reviewed.  Constitutional:      General: She is not in acute distress.    Appearance: She is not diaphoretic.  HENT:     Head: Normocephalic and atraumatic.     Right Ear: External ear normal.     Left Ear: External ear normal.     Nose: Nose normal.  Eyes:     General:        Right eye: No discharge.        Left eye: No discharge.     Conjunctiva/sclera: Conjunctivae normal.     Pupils: Pupils are equal, round, and reactive to light.  Neck:     Thyroid : No thyromegaly.     Vascular: No JVD.  Cardiovascular:     Rate and Rhythm: Normal rate and regular rhythm.     Heart sounds: Normal heart sounds. No murmur heard.    No friction rub. No gallop.  Pulmonary:     Effort: Pulmonary effort is normal.     Breath sounds: Normal breath sounds. No wheezing, rhonchi or rales.  Abdominal:     General: Bowel sounds are normal.      Palpations: Abdomen is soft. There is no mass.     Tenderness: There is no abdominal tenderness. There is no guarding.  Musculoskeletal:        General: Normal range of motion.     Cervical back: Normal range of motion and neck supple.  Lymphadenopathy:  Cervical: No cervical adenopathy.  Skin:    General: Skin is warm and dry.  Neurological:     Mental Status: She is alert.     Sensory: Sensation is intact.     Motor: Motor function is intact.     Deep Tendon Reflexes: Reflexes are normal and symmetric.     Comments: Upper neurologic normal     Wt Readings from Last 3 Encounters:  11/15/23 159 lb (72.1 kg)  07/29/23 150 lb (68 kg)  07/06/23 150 lb (68 kg)    BP 124/70   Pulse 92   Ht 4' 11 (1.499 m)   Wt 159 lb (72.1 kg)   SpO2 98%   BMI 32.11 kg/m   Assessment and Plan:  1. Neuropathy (Primary) New onset.  Episodic.  Began about 6 months and has been more frequent usually occurring at night with both hands with all fingers involved but this may be questionable.  There is no motor weakness associated with it and there is no cervical pain.  Differential includes radiculopathy of his cervical nature, paresthesias perhaps of a neuropathy versus a carpal tunnel concern.  And metabolic causes for neuropathy which we will initiate evaluation with B12 folic acid  TSH thyroid  thiamine .  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  We will begin workup for neuropathy with labs will recheck in 2 weeks I have suggested that she take over-the-counter NSAIDs to reduce inflammation.  If he should get worse in between now and then I would be glad to see her sooner than 2 weeks from now.  Incidentally patient also has paresthesias of her feet this is on a different timeframe she attributes that to a self diagnosed sciatica and this may be something that we need to take a  look at closer in the future involving the lumbar disc area. - B12 and Folate Panel - Thyroid  Panel With TSH - Vitamin B1 - CBC with Differential/Platelet    Cathryne Molt, MD

## 2023-11-17 ENCOUNTER — Other Ambulatory Visit: Payer: Self-pay

## 2023-11-20 ENCOUNTER — Encounter: Payer: Self-pay | Admitting: Family Medicine

## 2023-11-20 LAB — CBC WITH DIFFERENTIAL/PLATELET
Basophils Absolute: 0 10*3/uL (ref 0.0–0.2)
Basos: 1 %
EOS (ABSOLUTE): 0 10*3/uL (ref 0.0–0.4)
Eos: 1 %
Hematocrit: 39.5 % (ref 34.0–46.6)
Hemoglobin: 13 g/dL (ref 11.1–15.9)
Immature Grans (Abs): 0 10*3/uL (ref 0.0–0.1)
Immature Granulocytes: 0 %
Lymphocytes Absolute: 1.9 10*3/uL (ref 0.7–3.1)
Lymphs: 41 %
MCH: 28.8 pg (ref 26.6–33.0)
MCHC: 32.9 g/dL (ref 31.5–35.7)
MCV: 87 fL (ref 79–97)
Monocytes Absolute: 0.3 10*3/uL (ref 0.1–0.9)
Monocytes: 7 %
Neutrophils Absolute: 2.4 10*3/uL (ref 1.4–7.0)
Neutrophils: 50 %
Platelets: 234 10*3/uL (ref 150–450)
RBC: 4.52 x10E6/uL (ref 3.77–5.28)
RDW: 12.9 % (ref 11.7–15.4)
WBC: 4.7 10*3/uL (ref 3.4–10.8)

## 2023-11-20 LAB — B12 AND FOLATE PANEL
Folate: >20 ng/mL (ref >3.0)
Vitamin B-12: 938 pg/mL (ref 232–1245)

## 2023-11-20 LAB — THYROID PANEL WITH TSH
Free Thyroxine Index: 2.8 (ref 1.2–4.9)
T3 Uptake Ratio: 34 % (ref 24–39)
T4, Total: 8.3 ug/dL (ref 4.5–12.0)
TSH: 1.76 u[IU]/mL (ref 0.450–4.500)

## 2023-11-20 LAB — VITAMIN B1: Thiamine: 145.8 nmol/L (ref 66.5–200.0)

## 2023-11-29 ENCOUNTER — Ambulatory Visit
Admission: RE | Admit: 2023-11-29 | Discharge: 2023-11-29 | Disposition: A | Discharge status: Home / Self Care | Payer: 59 | Attending: Family Medicine | Admitting: Family Medicine

## 2023-11-29 ENCOUNTER — Ambulatory Visit: Payer: Commercial Managed Care - PPO | Admitting: Family Medicine

## 2023-11-29 ENCOUNTER — Encounter: Payer: Self-pay | Admitting: Family Medicine

## 2023-11-29 ENCOUNTER — Ambulatory Visit
Admission: RE | Admit: 2023-11-29 | Discharge: 2023-11-29 | Disposition: A | Discharge status: Home / Self Care | Payer: 59 | Source: Ambulatory Visit | Attending: Family Medicine | Admitting: Family Medicine

## 2023-11-29 VITALS — BP 118/76 | HR 74 | Ht 59.0 in | Wt 159.0 lb

## 2023-11-29 DIAGNOSIS — R202 Paresthesia of skin: Secondary | ICD-10-CM | POA: Diagnosis not present

## 2023-11-29 DIAGNOSIS — G629 Polyneuropathy, unspecified: Secondary | ICD-10-CM | POA: Diagnosis not present

## 2023-11-29 DIAGNOSIS — M47812 Spondylosis without myelopathy or radiculopathy, cervical region: Secondary | ICD-10-CM | POA: Diagnosis not present

## 2023-11-29 NOTE — Progress Notes (Signed)
Date:  11/29/2023   Name:  Caitlin Dean   DOB:  1961-10-12   MRN:  [id number]   Chief Complaint: Peripheral Neuropathy (Follow up from 2 weeks ago. Tingling in hands and feet since December. Still the same as last visit. )  Neurologic Problem The patient's primary symptoms include focal sensory loss. The patient's pertinent negatives include no altered mental status, clumsiness, focal weakness, loss of balance, memory loss, near-syncope, slurred speech, syncope, visual change or weakness. This is a chronic (start withn hand paresthesias) problem. The current episode started more than 1 month ago (6 months). The neurological problem developed gradually. The problem has been waxing and waning (awakened at night with hand parethesia) since onset. There was upper extremity, right-sided and left-sided focality noted. Pertinent negatives include no back pain, chest pain, dizziness, fatigue, headaches, light-headedness, nausea or palpitations. (No neck pain) Past treatments include acetaminophen and medication (ibuprofen). There is no history of a bleeding disorder, a CVA or head trauma.    Lab Results  Component Value Date   NA 144 07/01/2023   K 3.5 07/01/2023   CO2 26 07/01/2023   GLUCOSE 83 07/01/2023   BUN 16 07/01/2023   CREATININE 0.99 07/01/2023   CALCIUM 9.5 07/01/2023   EGFR 65 07/01/2023   GFRNONAA >60 12/31/2022   Lab Results  Component Value Date   CHOL 201 (H) 07/01/2023   HDL 65 07/01/2023   LDLCALC 125 (H) 07/01/2023   TRIG 61 07/01/2023   CHOLHDL 3.1 12/31/2022   Lab Results  Component Value Date   TSH 1.760 11/15/2023   Lab Results  Component Value Date   HGBA1C 6.0 (H) 07/01/2023   Lab Results  Component Value Date   WBC 4.7 11/15/2023   HGB 13.0 11/15/2023   HCT 39.5 11/15/2023   MCV 87 11/15/2023   PLT 234 11/15/2023   Lab Results  Component Value Date   ALT 22 07/06/2023   AST 24 07/06/2023   ALKPHOS 49 07/06/2023   BILITOT 0.3 07/06/2023    No results found for: "25OHVITD2", "25OHVITD3", "VD25OH"   Review of Systems  Constitutional:  Negative for fatigue.  HENT:  Negative for trouble swallowing.   Eyes:  Negative for visual disturbance.  Cardiovascular:  Negative for chest pain, palpitations and near-syncope.  Gastrointestinal:  Negative for blood in stool, constipation, diarrhea and nausea.  Endocrine: Negative for polydipsia and polyuria.  Genitourinary:  Negative for flank pain.  Musculoskeletal:  Negative for back pain.  Neurological:  Negative for dizziness, tremors, focal weakness, seizures, syncope, facial asymmetry, speech difficulty, weakness, light-headedness, numbness, headaches and loss of balance.  Hematological:  Negative for adenopathy.  Psychiatric/Behavioral:  Negative for memory loss.     Patient Active Problem List   Diagnosis Date Noted   Chills 09/18/2022   Status post total hysterectomy 02/12/2021   Special screening for malignant neoplasms, colon    Essential hypertension 07/17/2015   Routine history and physical examination of adult 07/02/2015   Prediabetes 04/12/2012   Allergic rhinitis 03/19/2012   Middle ear effusion 03/19/2012    Allergies  Allergen Reactions   Sulfa Antibiotics Rash    Past Surgical History:  Procedure Laterality Date   ABDOMINAL HYSTERECTOMY     COLONOSCOPY WITH PROPOFOL N/A 07/11/2020   Procedure: COLONOSCOPY WITH PROPOFOL;  Surgeon: Midge Minium, MD;  Location: [facility];  Service: Endoscopy;  Laterality: N/A;  priority 4    Social History   Tobacco Use   Smoking status: Never  Smokeless tobacco: Never  Vaping Use   Vaping status: Never Used  Substance Use Topics   Alcohol use: Not Currently   Drug use: Never     Medication list has been reviewed and updated.  Current Meds  Medication Sig   hydrochlorothiazide (HYDRODIURIL) 25 MG tablet Take 1 tablet (25 mg total) by mouth daily.   Multiple Vitamin (MULTIVITAMIN) tablet Take 1 tablet  by mouth daily.   Omega-3 Fatty Acids (FISH OIL PO) Take by mouth daily.   pantoprazole (PROTONIX) 40 MG tablet Take 1 tablet (40 mg total) by mouth daily.   potassium chloride SA (KLOR-CON M) 20 MEQ tablet Take 1 tablet (20 mEq total) by mouth daily.       11/29/2023    1:33 PM 11/15/2023    2:44 PM 07/29/2023    1:57 PM 07/06/2023    8:45 AM  GAD 7 : Generalized Anxiety Score  Nervous, Anxious, on Edge 0 0 0 0  Control/stop worrying 0 0 0 0  Worry too much - different things 0 0 0 0  Trouble relaxing 0 0 0 0  Restless 0 0 0 0  Easily annoyed or irritable 0 0 0 0  Afraid - awful might happen 0 0 0 0  Total GAD 7 Score 0 0 0 0  Anxiety Difficulty Not difficult at all Not difficult at all Not difficult at all Not difficult at all       11/29/2023    1:33 PM 11/15/2023    2:44 PM 07/29/2023    1:57 PM  Depression screen PHQ 2/9  Decreased Interest 0 0 0  Down, Depressed, Hopeless 0 0 0  PHQ - 2 Score 0 0 0  Altered sleeping 0 0 0  Tired, decreased energy 0 0 0  Change in appetite 0 0 0  Feeling bad or failure about yourself  0 0 0  Trouble concentrating 0 0 0  Moving slowly or fidgety/restless 0 0 0  Suicidal thoughts 0 0 0  PHQ-9 Score 0 0 0  Difficult doing work/chores Not difficult at all Not difficult at all Not difficult at all    BP Readings from Last 3 Encounters:  11/29/23 118/76  11/15/23 124/70  07/29/23 122/78    Physical Exam Vitals and nursing note reviewed.  Constitutional:      General: She is not in acute distress. HENT:     Head: Normocephalic.     Right Ear: Tympanic membrane and ear canal normal.     Left Ear: Tympanic membrane and ear canal normal.     Nose: Nose normal. No congestion or rhinorrhea.     Mouth/Throat:     Mouth: Mucous membranes are dry.     Pharynx: Oropharynx is clear. No oropharyngeal exudate or posterior oropharyngeal erythema.  Eyes:     General:        Right eye: No discharge.        Left eye: No discharge.      Extraocular Movements: Extraocular movements intact.     Conjunctiva/sclera: Conjunctivae normal.     Pupils: Pupils are equal, round, and reactive to light.  Pulmonary:     Breath sounds: No wheezing, rhonchi or rales.  Musculoskeletal:     Cervical back: Normal range of motion. No rigidity or tenderness.  Skin:    Capillary Refill: Capillary refill takes less than 2 seconds.  Neurological:     General: No focal deficit present.     Mental Status: She  is alert.     Cranial Nerves: Cranial nerves 2-12 are intact. No cranial nerve deficit.     Sensory: Sensation is intact. No sensory deficit.     Motor: Motor function is intact. No weakness.     Deep Tendon Reflexes: Reflexes normal.     Comments: Negative Phelen/negative Tinel     Wt Readings from Last 3 Encounters:  11/29/23 159 lb (72.1 kg)  11/15/23 159 lb (72.1 kg)  07/29/23 150 lb (68 kg)    BP 118/76   Pulse 74   Ht 4\' 11"  (1.499 m)   Wt 159 lb (72.1 kg)   SpO2 100%   BMI 32.11 kg/m   Assessment and Plan: 1. Paresthesia (Primary) Chronic.  Episodic only at night.  Involving both hands.  Unsure of fifth it is a selective nerve such as radial ulnar median.  Almost nightly.  Patient send differential includes carpal tunnel, cervical.  Radiculopathy, neuropathy.  Last visit we initiated lab work which was unremarkable.  Exam is negative for Tinel and Phalen which would suggest against carpal although not completely ruling out.  Patient may be turns her head when she sleeps which may suggest cervical radiculopathy therefore we will get a cervical spine x-ray we will obtain a neurology consult in case we need to consider the possibility of nerve conduction studies and may get a clue as to why the lower extremities involved and if not we will have patient return and then we will resume evaluation of the lower extremities we will hold on doing x-rays of the lumbar and only doing this cervical spine at this time. - Ambulatory  referral to Neurology - DG Cervical Spine Complete  2. Neuropathy As noted above x-rays will be initiated looking for a focal area of concern.  Lab work including B12 folic acid thiamine was was done last visit which was unremarkable. - Ambulatory referral to Neurology - DG Cervical Spine Complete  3. Paresthesia of both feet Patient has paresthesias of both feet but this is not as often as the hands involved therefore we will hold on further evaluation of this until we get a clue looking at the upper extremities after we have referred to neurology for consideration of a other source of paresthesias of both hands.  Patient has been invited to return for further evaluation of hands/feet paresthesias if this symptomatology continues to    Elizabeth Sauer, MD

## 2023-11-30 ENCOUNTER — Encounter: Payer: Self-pay | Admitting: Family Medicine

## 2023-12-30 ENCOUNTER — Ambulatory Visit: Payer: Commercial Managed Care - PPO | Admitting: Family Medicine

## 2023-12-30 ENCOUNTER — Encounter: Payer: Self-pay | Admitting: Family Medicine

## 2023-12-30 VITALS — BP 116/78 | HR 68 | Ht 59.0 in | Wt 158.0 lb

## 2023-12-30 DIAGNOSIS — R7303 Prediabetes: Secondary | ICD-10-CM

## 2023-12-30 DIAGNOSIS — G629 Polyneuropathy, unspecified: Secondary | ICD-10-CM

## 2023-12-30 DIAGNOSIS — K219 Gastro-esophageal reflux disease without esophagitis: Secondary | ICD-10-CM

## 2023-12-30 DIAGNOSIS — E7801 Familial hypercholesterolemia: Secondary | ICD-10-CM

## 2023-12-30 DIAGNOSIS — E876 Hypokalemia: Secondary | ICD-10-CM | POA: Diagnosis not present

## 2023-12-30 DIAGNOSIS — I10 Essential (primary) hypertension: Secondary | ICD-10-CM

## 2023-12-30 DIAGNOSIS — E78019 Familial hypercholesterolemia, unspecified: Secondary | ICD-10-CM

## 2023-12-30 MED ORDER — HYDROCHLOROTHIAZIDE 25 MG PO TABS: 25.0000 mg | ORAL_TABLET | Freq: Every day | ORAL | 1 refills | Status: AC

## 2023-12-30 MED ORDER — GABAPENTIN 100 MG PO CAPS: 100.0000 mg | ORAL_CAPSULE | Freq: Every day | ORAL | 5 refills | Status: AC

## 2023-12-30 MED ORDER — POTASSIUM CHLORIDE CRYS ER 20 MEQ PO TBCR: 20.0000 meq | EXTENDED_RELEASE_TABLET | Freq: Every day | ORAL | 1 refills | Status: AC

## 2023-12-30 MED ORDER — PANTOPRAZOLE SODIUM 40 MG PO TBEC: 40.0000 mg | DELAYED_RELEASE_TABLET | Freq: Every day | ORAL | 1 refills | Status: AC

## 2023-12-30 NOTE — Patient Instructions (Signed)

## 2023-12-30 NOTE — Progress Notes (Signed)
 Date:  12/30/2023   Name:  Caitlin Dean   DOB:  1960/11/16   MRN:  [id number]   Chief Complaint: Peripheral Neuropathy (Follow up from 1 month ago. Symptoms are the same. )  Neurologic Problem The patient's primary symptoms include focal sensory loss. The patient's pertinent negatives include no altered mental status, clumsiness, focal weakness, loss of balance, memory loss, near-syncope, slurred speech, syncope, visual change or weakness. This is a chronic problem. The current episode started more than 1 year ago. The problem has been gradually improving since onset. There was lower extremity focality noted. Pertinent negatives include no abdominal pain, auditory change, aura, back pain, bladder incontinence, bowel incontinence, chest pain, confusion, diaphoresis, dizziness, fatigue, fever, headaches, light-headedness, nausea, neck pain, palpitations, shortness of breath, vertigo or vomiting. Past treatments include nothing. The treatment provided moderate relief. There is no history of a CVA.    Lab Results  Component Value Date   NA 144 07/01/2023   K 3.5 07/01/2023   CO2 26 07/01/2023   GLUCOSE 83 07/01/2023   BUN 16 07/01/2023   CREATININE 0.99 07/01/2023   CALCIUM 9.5 07/01/2023   EGFR 65 07/01/2023   GFRNONAA >60 12/31/2022   Lab Results  Component Value Date   CHOL 201 (H) 07/01/2023   HDL 65 07/01/2023   LDLCALC 125 (H) 07/01/2023   TRIG 61 07/01/2023   CHOLHDL 3.1 12/31/2022   Lab Results  Component Value Date   TSH 1.760 11/15/2023   Lab Results  Component Value Date   HGBA1C 6.0 (H) 07/01/2023   Lab Results  Component Value Date   WBC 4.7 11/15/2023   HGB 13.0 11/15/2023   HCT 39.5 11/15/2023   MCV 87 11/15/2023   PLT 234 11/15/2023   Lab Results  Component Value Date   ALT 22 07/06/2023   AST 24 07/06/2023   ALKPHOS 49 07/06/2023   BILITOT 0.3 07/06/2023   No results found for: "25OHVITD2", "25OHVITD3", "VD25OH"   Review of Systems   Constitutional: Negative.  Negative for chills, diaphoresis, fatigue, fever and unexpected weight change.  HENT:  Negative for congestion, ear discharge, ear pain, rhinorrhea, sinus pressure, sneezing and sore throat.   Respiratory:  Negative for cough, shortness of breath, wheezing and stridor.   Cardiovascular:  Negative for chest pain, palpitations and near-syncope.  Gastrointestinal:  Negative for abdominal pain, blood in stool, bowel incontinence, constipation, diarrhea, nausea and vomiting.  Genitourinary:  Negative for bladder incontinence, dysuria, flank pain, frequency, hematuria, urgency and vaginal discharge.  Musculoskeletal:  Negative for arthralgias, back pain, myalgias and neck pain.  Skin:  Negative for rash.  Neurological:  Negative for dizziness, vertigo, focal weakness, syncope, weakness, light-headedness, headaches and loss of balance.  Hematological:  Negative for adenopathy. Does not bruise/bleed easily.  Psychiatric/Behavioral:  Negative for confusion, dysphoric mood and memory loss. The patient is not nervous/anxious.     Patient Active Problem List   Diagnosis Date Noted   Chills 09/18/2022   Status post total hysterectomy 02/12/2021   Special screening for malignant neoplasms, colon    Essential hypertension 07/17/2015   Routine history and physical examination of adult 07/02/2015   Prediabetes 04/12/2012   Allergic rhinitis 03/19/2012   Middle ear effusion 03/19/2012    Allergies  Allergen Reactions   Sulfa Antibiotics Rash    Past Surgical History:  Procedure Laterality Date   ABDOMINAL HYSTERECTOMY     COLONOSCOPY WITH PROPOFOL N/A 07/11/2020   Procedure: COLONOSCOPY WITH PROPOFOL;  Surgeon:  Midge Minium, MD;  Location[facility];  Service: Endoscopy;  Laterality: N/A;  priority 4    Social History   Tobacco Use   Smoking status: Never   Smokeless tobacco: Never  Vaping Use   Vaping status: Never Used  Substance Use Topics   Alcohol  use: Not Currently   Drug use: Never     Medication list has been reviewed and updated.  Current Meds  Medication Sig   hydrochlorothiazide (HYDRODIURIL) 25 MG tablet Take 1 tablet (25 mg total) by mouth daily.   Multiple Vitamin (MULTIVITAMIN) tablet Take 1 tablet by mouth daily.   Omega-3 Fatty Acids (FISH OIL PO) Take by mouth daily.   pantoprazole (PROTONIX) 40 MG tablet Take 1 tablet (40 mg total) by mouth daily.   potassium chloride SA (KLOR-CON M) 20 MEQ tablet Take 1 tablet (20 mEq total) by mouth daily.       12/30/2023    8:43 AM 11/29/2023    1:33 PM 11/15/2023    2:44 PM 07/29/2023    1:57 PM  GAD 7 : Generalized Anxiety Score  Nervous, Anxious, on Edge 0 0 0 0  Control/stop worrying 0 0 0 0  Worry too much - different things 0 0 0 0  Trouble relaxing 0 0 0 0  Restless 0 0 0 0  Easily annoyed or irritable 0 0 0 0  Afraid - awful might happen 0 0 0 0  Total GAD 7 Score 0 0 0 0  Anxiety Difficulty Not difficult at all Not difficult at all Not difficult at all Not difficult at all       12/30/2023    8:43 AM 11/29/2023    1:33 PM 11/15/2023    2:44 PM  Depression screen PHQ 2/9  Decreased Interest 0 0 0  Down, Depressed, Hopeless 0 0 0  PHQ - 2 Score 0 0 0  Altered sleeping 0 0 0  Tired, decreased energy 0 0 0  Change in appetite 0 0 0  Feeling bad or failure about yourself  0 0 0  Trouble concentrating 0 0 0  Moving slowly or fidgety/restless 0 0 0  Suicidal thoughts 0 0 0  PHQ-9 Score 0 0 0  Difficult doing work/chores Not difficult at all Not difficult at all Not difficult at all    BP Readings from Last 3 Encounters:  12/30/23 116/78  11/29/23 118/76  11/15/23 124/70    Physical Exam Vitals and nursing note reviewed.  Constitutional:      General: She is not in acute distress.    Appearance: She is not diaphoretic.  HENT:     Head: Normocephalic and atraumatic.     Right Ear: External ear normal.     Left Ear: External ear normal.     Nose:  Nose normal.  Eyes:     General:        Right eye: No discharge.        Left eye: No discharge.     Conjunctiva/sclera: Conjunctivae normal.     Pupils: Pupils are equal, round, and reactive to light.  Neck:     Thyroid: No thyromegaly.     Vascular: No JVD.  Cardiovascular:     Rate and Rhythm: Normal rate and regular rhythm.     Heart sounds: Normal heart sounds. No murmur heard.    No friction rub. No gallop.  Pulmonary:     Effort: Pulmonary effort is normal.  Breath sounds: Normal breath sounds.  Abdominal:     General: Bowel sounds are normal.     Palpations: Abdomen is soft. There is no mass.     Tenderness: There is no abdominal tenderness. There is no guarding.  Musculoskeletal:        General: Normal range of motion.     Cervical back: Normal range of motion and neck supple.  Lymphadenopathy:     Cervical: No cervical adenopathy.  Skin:    General: Skin is warm and dry.  Neurological:     Mental Status: She is alert.     Deep Tendon Reflexes: Reflexes are normal and symmetric.     Wt Readings from Last 3 Encounters:  12/30/23 158 lb (71.7 kg)  11/29/23 159 lb (72.1 kg)  11/15/23 159 lb (72.1 kg)    BP 116/78   Pulse 68   Ht 4\' 11"  (1.499 m)   Wt 158 lb (71.7 kg)   SpO2 97%   BMI 31.91 kg/m   Assessment and Plan: 1. Essential hypertension (Primary) Chronic.  Controlled.  Stable.  Blood pressure today 116/78.  Asymptomatic.  Tolerating medications well.  Continue hydrochlorothiazide 25 mg once a day.  Will check CMP for electrolytes and GFR.  Will recheck patient in 6 months. - hydrochlorothiazide (HYDRODIURIL) 25 MG tablet; Take 1 tablet (25 mg total) by mouth daily.  Dispense: 90 tablet; Refill: 1 - Comprehensive metabolic panel  2. Hypokalemia Chronic.  Controlled.  Stable.  Asymptomatic without palpitations.  Continue potassium supplementation 20 mEq. - potassium chloride SA (KLOR-CON M) 20 MEQ tablet; Take 1 tablet (20 mEq total) by mouth daily.   Dispense: 90 tablet; Refill: 1  3. Neuropathy New onset.  Persistent but improved.  Primarily at night lower extremities.  There is perhaps some mild restless leg symptoms but this is primarily paresthesias.  Full workup with thiamine B12 thyroid all negative.  We will initiate gabapentin 100 mg nightly and see how this handles issues with either neuropathy or perhaps restless leg. - gabapentin (NEURONTIN) 100 MG capsule; Take 1 capsule (100 mg total) by mouth at bedtime.  Dispense: 30 capsule; Refill: 5  4. Familial hypercholesterolemia Chronic.  Controlled.  Stable.  Currently controlled with diet and we will get low triglyceride and low cholesterol guidelines.  Will check lipid panel for current level of control and CMP for hepatic concerns.  Patient has been instructed to exercise to lose weight. - Lipid Panel With LDL/HDL Ratio - Comprehensive metabolic panel  5. Prediabetes Chronic.  Asymptomatic..  Stable.  Will check A1c for current level of control with dietary approach eliminating concentrated sweets and monitoring carbohydrate intake. - Comprehensive metabolic panel - Hemoglobin A1c  6. Gastroesophageal reflux disease, unspecified whether esophagitis present Chronic.  Controlled.  Stable.  Continue pantoprazole 40 mg 1 daily. - pantoprazole (PROTONIX) 40 MG tablet; Take 1 tablet (40 mg total) by mouth daily.  Dispense: 90 tablet; Refill: 1     Elizabeth Sauer, MD

## 2023-12-31 DIAGNOSIS — R7303 Prediabetes: Secondary | ICD-10-CM | POA: Diagnosis not present

## 2023-12-31 DIAGNOSIS — I10 Essential (primary) hypertension: Secondary | ICD-10-CM | POA: Diagnosis not present

## 2023-12-31 DIAGNOSIS — E7801 Familial hypercholesterolemia: Secondary | ICD-10-CM | POA: Diagnosis not present

## 2024-01-01 ENCOUNTER — Telehealth: Payer: Self-pay | Admitting: Family Medicine

## 2024-01-01 ENCOUNTER — Other Ambulatory Visit: Payer: Self-pay | Admitting: Family Medicine

## 2024-01-01 DIAGNOSIS — G629 Polyneuropathy, unspecified: Secondary | ICD-10-CM

## 2024-01-01 LAB — COMPREHENSIVE METABOLIC PANEL
ALT: 19 IU/L (ref 0–32)
AST: 25 IU/L (ref 0–40)
Albumin: 4.7 g/dL (ref 3.9–4.9)
Alkaline Phosphatase: 69 IU/L (ref 44–121)
BUN/Creatinine Ratio: 16 (ref 12–28)
BUN: 17 mg/dL (ref 8–27)
Bilirubin Total: 0.5 mg/dL (ref 0.0–1.2)
CO2: 27 mmol/L (ref 20–29)
Calcium: 10 mg/dL (ref 8.7–10.3)
Chloride: 102 mmol/L (ref 96–106)
Creatinine, Ser: 1.08 mg/dL — ABNORMAL HIGH (ref 0.57–1.00)
Globulin, Total: 2.6 g/dL (ref 1.5–4.5)
Glucose: 89 mg/dL (ref 70–99)
Potassium: 4.8 mmol/L (ref 3.5–5.2)
Sodium: 145 mmol/L — ABNORMAL HIGH (ref 134–144)
Total Protein: 7.3 g/dL (ref 6.0–8.5)
eGFR: 58 mL/min/{1.73_m2} — ABNORMAL LOW (ref >59)

## 2024-01-01 LAB — LIPID PANEL WITH LDL/HDL RATIO
Cholesterol, Total: 237 mg/dL — ABNORMAL HIGH (ref 100–199)
HDL: 62 mg/dL (ref >39)
LDL Chol Calc (NIH): 161 mg/dL — ABNORMAL HIGH (ref 0–99)
LDL/HDL Ratio: 2.6 ratio (ref 0.0–3.2)
Triglycerides: 82 mg/dL (ref 0–149)
VLDL Cholesterol Cal: 14 mg/dL (ref 5–40)

## 2024-01-01 LAB — HEMOGLOBIN A1C
Est. average glucose Bld gHb Est-mCnc: 123 mg/dL
Hgb A1c MFr Bld: 5.9 % — ABNORMAL HIGH (ref 4.8–5.6)

## 2024-01-01 NOTE — Telephone Encounter (Signed)
 Referral placed for neurology to evaluate for peripheral neuropathy.

## 2024-01-21 DIAGNOSIS — R202 Paresthesia of skin: Secondary | ICD-10-CM | POA: Diagnosis not present

## 2024-01-21 DIAGNOSIS — R200 Anesthesia of skin: Secondary | ICD-10-CM | POA: Diagnosis not present

## 2024-01-26 NOTE — Telephone Encounter (Signed)
error 

## 2024-04-04 NOTE — Procedures (Signed)
 [facility] - Neurology Department [address]  [place], [place] [address] [phone] (Phone);  [phone] (Fax) Test Date:  03/13/2024  Patient: Caitlin Dean [facility]) DOB: 1961/03/11 Physician: Dr. Arthea Farrow  Chart#: [id number] Sex: Female Ref Phys: Dr. Arthea Farrow   Patient History: Patient is a 63 year-old female who presents with bilteral hand, leg and feet tingling.  Denies numbness.  Denies back and neck pain.    Exam: Patchy sensory disturbance is noted in the hands and feet in a stocking and gloves distribution.  EMG & NCV Findings: Evaluation of the Left median sensory nerve showed decreased conduction velocity (Wrist-2nd Digit, 36 m/s).  The Left median/ulnar (palm) comparison nerve showed abnormal peak latency difference (Median Palm-Ulnar Palm, 0.6 ms).  All remaining nerves (as indicated in the following tables) were within normal limits.    EMG   Side Muscle Nerve Root Ins Act Fibs Psw Amp Dur Poly Recrt Int Bruna Comment  Left Abd Poll Brev Median C8-T1 Nml Nml Nml Nml Nml 0 Nml Nml   Left 1stDorInt Ulnar C8-T1 Nml Nml Nml Nml Nml 0 Nml Nml   Left PronatorTeres Median C6-7 Nml Nml Nml Nml Nml 0 Nml Nml   Left Biceps Musculocut C5-6 Nml Nml Nml Nml Nml 0 Nml Nml   Left Triceps Radial C6-7-8 Nml Nml Nml Nml Nml 0 Nml Nml    Impression: Normal study. There is no electrodiagnostic evidence of a large fiber neuropathy or myopathy on this study.     Thank you for the referral of this patient. It was our privilege to participate in care of your patient.  Feel free to contact us  with any further questions.   _____________________________ Arthea Farrow, M.D.  Nerve Conduction Studies Anti Sensory Summary Table   Stim Site NR Peak (ms) Norm Peak (ms) P-T Amp (V) Norm P-T Amp Site1 Site2 Dist (cm) Vel (m/s) Norm Vel (m/s)  Left Median Anti Sensory (2nd Digit)  Wrist    3.6 <3.6 34.0 >10 Wrist 2nd Digit 13.0 36 >39  Left Radial Anti Sensory  (Base 1st Digit)  Wrist    2.0 <2.5 39.5  Wrist Base 1st Digit 0.0    Left Sup Peron Anti Sensory (Ant Lat Mall)  14 cm    3.9 <4.4 14.2 >5.0 14 cm Ant Lat Mall 14.0 36 >32  Left Sural Anti Sensory (Lat Mall)  Calf    3.6 <4.0 15.0 >5.0 Calf Lat Mall 14.0 39 >35  Site 2    3.4  12.2        Left Ulnar Anti Sensory (5th Digit)  Wrist    2.6 <3.7 57.7 >15.0 Wrist 5th Digit 12.0 46 >38   Motor Summary Table   Stim Site NR Onset (ms) Norm Onset (ms) O-P Amp (mV) Norm O-P Amp Site1 Site2 Dist (cm) Vel (m/s) Norm Vel (m/s)  Left Median Motor (Abd Poll Brev)  Wrist    2.9 <4.5 8.3 >3 Elbow Wrist 20.0 51 >48  Elbow    6.8  7.9        Left Peroneal Motor (Ext Dig Brev)  Ankle    4.1 <6.6 5.1 >2.0 B Fib Ankle 29.5 57 >38  B Fib    9.3  6.2  Poplt B Fib 8.5 71 >40  Poplt    10.5  5.7        Left Tibial Motor (Abd Hall Brev)  Ankle    4.1 <6.6 7.5 >2.0 Knee Ankle 35.5 51 >42  Knee    11.1  4.7        Left Ulnar Motor (Abd Dig Minimi)  Wrist    2.6 <3.6 8.7 >5 B Elbow Wrist 20.0 69 >48  B Elbow    5.5  8.7  A Elbow B Elbow 10.0 71 >48  A Elbow    6.9  8.5         Comparison Summary Table   Stim Site NR Peak (ms) Norm Peak (ms) P-T Amp (V) Site1 Site2 Delta-P (ms) Norm Delta (ms)  Left Median/Ulnar Palm Comparison (Wrist - 8cm)  Median Palm    2.2 <2.5 24.8 Median Palm Ulnar Palm 0.6 <0.3  Ulnar Palm    1.6 <2.5 21.0       Waveforms:                         I have reviewed, edited and added to the note as needed to reflect my best personal medical judgment.    Dr. Arthea Farrow, MD [facility] [place], [place] Ph:  [phone] Fax:  [phone]

## 2024-04-14 NOTE — Progress Notes (Signed)
 Caitlin Dean is a 63 y.o. female that comes today for the following problem(s):  No chief complaint on file.   HPI:  Previously followed with Dr. Joshua [facility] History of Present Illness Caitlin Dean is a 63 year old female with hypertension who presents for a new patient visit and medication refill.  She recently retired in March and is here to establish care and manage her hypertension. She requires a refill of hydrochlorothiazide , having run out of her medication for about a week. Despite this, her blood pressure has not been significantly elevated.  She has a history of hypertension and is currently taking hydrochlorothiazide . She also takes a potassium supplement, which was increased by her previous physician due to concerns about low potassium levels. However, her potassium levels were noted to be on the high side during her last check in February.  She has a history of prediabetes with an A1c of 5.9. She also has a history of high cholesterol, which was noted to be elevated in her last lab work in February.  No history of smoking or alcohol use. She is married with three children who live nearby, and she resides in [place] with her husband. 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  She sleeps well but acknowledges needing more sleep, currently getting about six hours per night. No issues with constipation, falls, or mobility.   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 Allergic rhinitis  . Middle ear effusion  . Pre-diabetes  . Essential hypertension     Past  Medical History:  Diagnosis Date  . Diabetes mellitus type 2, uncomplicated (CMS/HHS-HCC)   . Encounter for blood transfusion   . GERD (gastroesophageal reflux disease)   . Hypertension      Past Surgical History:  Procedure Laterality Date  . HYSTERECTOMY  2010    Symptomatic uterine fibroids anemia   . SCREENING COLONOSCOPY N/A 07/05/2012   Procedure: SCREENING COLONOSCOPY;  Surgeon: Elijah DELENA SHAUNNA Tanda, MD;  Location: [facility];  Service: Gastroenterology;  Laterality: N/A;    Social History   Socioeconomic History  . Marital status: Married  . Number of children: 3  Occupational History  . 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 Smoking status: Never  . Smokeless tobacco: Never  Vaping Use  . Vaping status: Never Used  Substance and Sexual Activity  . Alcohol use: No  . Drug use: No  . Sexual activity: Yes    Partners: Male    Birth control/protection: Surgical  Social History Narrative    She denies tobacco, alcohol or illicit drug abuse. 9 miles a week running 2 sessions strength   She works at [employer] in the [place]   Social Drivers of [employer]: Low Risk  (04/14/2024)   Overall Financial Resource Strain (CARDIA)   . Difficulty of Paying Living Expenses: Not hard at all  Food Insecurity: No Food Insecurity (04/14/2024)   Hunger Vital Sign   . Worried About Programme Researcher, Broadcasting/film/video in the Last Year:  Never true   . Ran Out of Food in the Last Year: Never true  Transportation Needs: No Transportation Needs (04/14/2024)   PRAPARE - Transportation   . Lack of Transportation (Medical): No   . Lack of Transportation (Non-Medical): No  Physical Activity: Sufficiently Active (11/15/2023)   Received from [facility]   Exercise Vital Sign   . On average, how many days per week do you engage in moderate to strenuous exercise (like a brisk walk)?: 5 days   . On average, how many minutes do you engage in exercise at this level?: 60 min   Stress: No Stress Concern Present (11/15/2023)   Received from [facility] of Occupational Health - Occupational Stress Questionnaire   . Feeling of Stress : Not at all  Social Connections: Socially Integrated (11/15/2023)   Received from [facility]   Social Connection and Isolation Panel   . In a typical week, how many times do you talk on the phone with family, friends, or neighbors?: More than three times a week   . How often do you get together with friends or relatives?: Once a week   . How often do you attend church or religious services?: 1 to 4 times per year   . Do you belong to any clubs or organizations such as church groups, unions, fraternal or athletic groups, or school groups?: Yes   . How often do you attend meetings of the clubs or organizations you belong to?: 1 to 4 times per year   . Are you married, widowed, divorced, separated, never married, or living with a partner?: Married  Housing Stability: Low Risk  (04/14/2024)   Housing Stability Vital Sign   . Unable to Pay for Housing in the Last Year: No   . Number of Times Moved in the Last Year: 0   . Homeless in the Last Year: No     Family History  Problem Relation Name Age of Onset  . High blood pressure (Hypertension) Mother    . No Known Problems Brother    . Pancreatic cancer Brother        Current Outpatient Medications:  .  ASHWAGANDHA EXTRACT ORAL, Take 1 capsule by mouth once daily, Disp: , Rfl:  .  hydroCHLOROthiazide  (HYDRODIURIL ) 25 MG tablet, Take 1 tablet (25 mg total) by mouth once daily for 360 days, Disp: 90 tablet, Rfl: 3 .  MULTIVITAMIN ORAL, Take by mouth, Disp: , Rfl:  .  omega-3 fatty acids-fish oil 300-1,000 mg capsule, Take by mouth once daily, Disp: , Rfl:  .  potassium chloride  (KLOR-CON  M20) 20 MEQ ER tablet, Take 1 tablet (20 mEq total) by mouth once daily, Disp: 90 tablet, Rfl: 1 .  safflower oil/linoleic acid,co (CLA ORAL), Take 2 capsules by mouth 2 (two) times  daily With meals.   Used to help with weight loss, Disp: , Rfl:    ROS: negative    Objective:  BP 126/78 (BP Location: Left upper arm, Patient Position: Sitting, BP Cuff Size: Large Adult)   Pulse 67   Ht 149.9 cm (4' 11)   Wt 67.6 kg (149 lb)   SpO2 96%   BMI 30.09 kg/m   Physical Exam CARDIOVASCULAR: Systolic murmur present.    Physical Examination:  GENERAL:  Well appearing middle aged lady, fully alert, oriented and in no acute distress.  HEENT:  NCAT EOMI  LUNGS:  CTAB   CARDIAC:  Regular rate and rhythm, normal S1 and  S2 without murmurs, rubs or gallops.   VASCULAR:  radial pulses 2+  ABDOMEN:  Soft, with normal bowel sounds.  No organomegaly or tenderness found.   EXTREMITIES:  Full range of motion with no erythema, heat or effusion.  No cyanosis, clubbing or edema noted.  NEUROLOGIC:  The patient is alert and oriented.  Cranial nerves II-XII intact.  Motor and sensory examinations within normal limits.  Gait normal     A/P Assessment & Plan Hypertension Chronic hypertension managed with hydrochlorothiazide . She ran out of medication for a week but reports that her blood pressure was not significantly elevated during that time. Blood pressure is currently well-controlled. Hydrochlorothiazide  can cause potassium loss, necessitating potassium supplementation. - Refill hydrochlorothiazide  prescription - Cont potassium supplementation, but will recheck BMP next visit to reassess need for that  Potassium Supplementation On potassium supplementation due to hydrochlorothiazide  use, which can cause potassium loss. Recent labs showed potassium levels on the high side. Advised to reduce potassium supplementation to every other day and recheck levels in six months. - Reduce potassium supplementation to every other day - Recheck potassium levels in six months  Hyperlipidemia Cholesterol levels are elevated. Advised to make lifestyle changes to manage cholesterol levels.  If levels remain high, medication may be considered in the future. - Recommend dietary modifications to reduce cholesterol intake - Recheck cholesterol levels in six months  Prediabetes A1c is 5.9, indicating prediabetes. Advised to reduce intake of high-starch foods to manage blood sugar levels. - Advise dietary modifications to reduce intake of high-starch foods  General Health Maintenance Generally healthy and up to date with most screenings. Colonoscopy was negative in 2021, and mammogram was done in 2024. Had a hysterectomy, so Pap smears are not applicable. - Schedule colonoscopy in 2026 - Schedule mammogram in 2025 - Encourage regular exercise focusing on muscular endurance and cardiovascular fitness - Advise increasing protein and vegetable intake for weight management  Follow-up Scheduled for a follow-up visit in six months to reassess her conditions and perform necessary lab tests. - Schedule follow-up visit in six months - Perform blood tests during the next visit  Recording duration: 15 minutes    Orders Placed This Encounter  Procedures  . Depression Screen -(PHQ- 2/9, BDI)     Diagnoses and all orders for this visit:  Essential hypertension  Depression screening (Z13.31) -     Depression Screen -(PHQ- 2/9, BDI)  Pre-diabetes  History of hysterectomy including cervix  Other orders -     hydroCHLOROthiazide  (HYDRODIURIL ) 25 MG tablet; Take 1 tablet (25 mg total) by mouth once daily for 360 days -     potassium chloride  (KLOR-CON  M20) 20 MEQ ER tablet; Take 1 tablet (20 mEq total) by mouth once daily     Return in about 6 months (around 10/14/2024).   I personally spent 45 minutes face-to-face and non-face-to-face in the care of this patient, which includes all pre, intra, and post visit time on the date of service.  Doretta Urbano Button, MD

## 2024-09-06 NOTE — Progress Notes (Signed)
 Caitlin Dean is a 63 y.o. female that comes today for the following problem(s):   Chief Complaint  Patient presents with  . Leg Pain    Right side Started about two weeks ago     HPI: History of Present Illness Caitlin Dean is a 63 year old female who presents with leg pain.  She has been experiencing sharp leg pain for approximately two weeks, primarily located in the leg and hip area. The pain is worse at night, often waking her up around 1 AM and again at 4 AM. Despite the pain, she is able to walk without difficulty. She applies Vicks or muscle rubs at night, which provides some relief.  She recalls a similar episode of pain last year, initially attributed to her sciatic nerve, which resolved without specific treatment. She has not pursued physical therapy for her current symptoms.  No significant cramping or tingling, although she occasionally experiences mild tingling.  Her current medications include hydrochlorothiazide  25 mg and potassium supplements. She monitors her blood pressure at home, which averages around 130/80 mmHg.  In terms of her social history, she is retired and engages in regular physical activity, including jogging and light weightlifting about five days a week. She is also learning to play several musical instruments, which involves sitting for extended periods.     Patient Active Problem List  Diagnosis  . Allergic rhinitis  . Middle ear effusion  . Pre-diabetes  . Essential hypertension     Past Medical History:  Diagnosis Date  . Diabetes mellitus type 2, uncomplicated (CMS/HHS-HCC)   . Encounter for blood transfusion   . GERD (gastroesophageal reflux disease)   . Hypertension      Past Surgical History:  Procedure Laterality Date  . HYSTERECTOMY  2010    Symptomatic uterine fibroids anemia   . SCREENING COLONOSCOPY N/A 07/05/2012   Procedure: SCREENING COLONOSCOPY;  Surgeon: Elijah DELENA SHAUNNA Tanda, MD;  Location: [facility];  Service: Gastroenterology;  Laterality: N/A;    Social History   Socioeconomic History  . Marital status: Married  . Number of children: 3  Occupational History  . 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 Smoking status: Never  . Smokeless tobacco: Never  Vaping Use  . Vaping status: Never Used  Substance and Sexual Activity  . Alcohol use: No  . Drug use: No  . Sexual activity: Yes    Partners: Male    Birth control/protection: Surgical  Social History Narrative    She denies tobacco, alcohol or illicit drug abuse. 9 miles a week running 2 sessions strength   She works at [employer] in the [place]   Social Drivers of [employer]: Low Risk  (04/14/2024)   Overall Financial Resource Strain (CARDIA)   . Difficulty of Paying Living Expenses: Not hard at all  Food Insecurity: No Food Insecurity (04/14/2024)   Hunger Vital Sign   . Worried About Programme Researcher, Broadcasting/film/video in the Last Year: Never true   . Ran Out of Food in the Last Year: Never true  Transportation Needs: No Transportation Needs (04/14/2024)   PRAPARE - Transportation   . Lack of Transportation (Medical): No   . Lack of Transportation (Non-Medical): No  Physical Activity: Sufficiently Active (11/15/2023)   Received from [facility]   Exercise Vital Sign   . On average, how many days per week do you engage in moderate to strenuous exercise (like a brisk walk)?: 5  days   . On average, how many minutes do you engage in exercise at this level?: 60 min  Stress: No Stress Concern Present (11/15/2023)   Received from [facility] - Occupational Stress Questionnaire   . Feeling of Stress : Not at all  Social Connections: Socially Integrated (11/15/2023)   Received from [facility]   Social Connection and Isolation Panel   . In a typical week, how many times do you talk on the phone with family, friends, or neighbors?: More than three times  a week   . How often do you get together with friends or relatives?: Once a week   . How often do you attend church or religious services?: 1 to 4 times per year   . Do you belong to any clubs or organizations such as church groups, unions, fraternal or athletic groups, or school groups?: Yes   . How often do you attend meetings of the clubs or organizations you belong to?: 1 to 4 times per year   . Are you married, widowed, divorced, separated, never married, or living with a partner?: Married  Housing Stability: Low Risk  (04/14/2024)   Housing Stability Vital Sign   . Unable to Pay for Housing in the Last Year: No   . Number of Times Moved in the Last Year: 0   . Homeless in the Last Year: No     Family History  Problem Relation Name Age of Onset  . High blood pressure (Hypertension) Mother    . No Known Problems Brother    . Pancreatic cancer Brother        Current Outpatient Medications:  .  ASHWAGANDHA EXTRACT ORAL, Take 1 capsule by mouth once daily, Disp: , Rfl:  .  hydroCHLOROthiazide  (HYDRODIURIL ) 25 MG tablet, Take 1 tablet (25 mg total) by mouth once daily for 360 days, Disp: 90 tablet, Rfl: 3 .  MULTIVITAMIN ORAL, Take by mouth, Disp: , Rfl:  .  omega-3 fatty acids-fish oil 300-1,000 mg capsule, Take by mouth once daily, Disp: , Rfl:  .  potassium chloride  (KLOR-CON  M20) 20 MEQ ER tablet, Take 1 tablet (20 mEq total) by mouth once daily, Disp: 90 tablet, Rfl: 1   ROS: - Msk type pain, right low back, right hip, right leg pain    Objective:  BP 138/88 (BP Location: Left upper arm, Patient Position: Sitting, BP Cuff Size: Adult)   Pulse 75   Ht 149.9 cm (4' 11)   Wt 72.1 kg (159 lb)   SpO2 98%   BMI 32.11 kg/m   Physical Exam MUSCULOSKELETAL: Lower back non-tender to palpation with mild tenderness on right side bending.    Physical Examination:  GENERAL:  Well appearing middle aged lady, fully alert, oriented and in no acute distress.  HEENT:  NCAT EOMI   LUNGS:  CTAB   CARDIAC:  Regular rate and rhythm, normal S1 and S2 without murmurs, rubs or gallops.   VASCULAR:  radial pulses 2+  ABDOMEN:  Soft, with normal bowel sounds.  No organomegaly or tenderness found.   EXTREMITIES:  Full range of motion with no erythema, heat or effusion.  No cyanosis, clubbing or edema noted.  MSK: Mild tenderness to palpation of perispinal muscles in low back and right hips  NEUROLOGIC:  The patient is alert and oriented.  Cranial nerves II-XII intact.  Motor and sensory examinations within normal limits.  Gait normal     A/P Assessment &  Plan Right leg musculoskeletal pain Intermittent sharp pain in the right leg, primarily at night, with occasional tingling. Pain is musculoskeletal in nature, likely due to muscle and bone issues rather than nerve involvement. No weakness or significant neurological deficits. Differential includes muscle tightness or tendon stress. MRI not indicated due to lack of weakness or significant neurological symptoms. - Referred to physical therapy for evaluation and tailored stretching exercises. - Provided stretching exercises for home use. - Recommended Tylenol 1000 mg three times daily as needed for pain. - Advised ibuprofen 800 mg every eight hours as needed for pain, with caution regarding potential blood pressure elevation. - Ordered CMP to check electrolytes.  Essential hypertension Blood pressure slightly elevated today, possibly due to pain or clinic visit. Home readings typically around 130/80 mmHg, which is within acceptable range. - Advised monitoring blood pressure at home for one to two weeks and reporting readings via MyChart. - Continue current antihypertensive regimen with hydrochlorothiazide  and potassium supplementation.    Orders Placed This Encounter  Procedures  . Comprehensive Metabolic Panel (CMP)    Release to patient:   Immediate  . Vitamin D, 25-Hydroxy - [facility]    Release to patient:   Immediate   . CBC w/auto Differential (3 Part)    Release to patient:   Immediate  . Ambulatory Referral to Physical Therapy    Referral Priority:   Routine    Referral Type:   Consultation    Requested Specialty:   Physical Therapy    Number of Visits Requested:   1  . Depression Screen -(PHQ- 2/9, BDI)     Diagnoses and all orders for this visit:  Anterior leg pain, right -     Ambulatory Referral to Physical Therapy  Essential hypertension  Encounter for vitamin deficiency screening -     Vitamin D, 25-Hydroxy - [facility]  Pre-diabetes -     Comprehensive Metabolic Panel (CMP) -     CBC w/auto Differential (3 Part)  Depression screening (Z13.31) -     Depression Screen -(PHQ- 2/9, BDI)     I personally spent 30 minutes face-to-face and non-face-to-face in the care of this patient, which includes all pre, intra, and post visit time on the date of service.  Doretta Urbano Button, MD  This note has been created using automated tools and reviewed for accuracy by ULYSSES NAJIN TOCHE.

## 2024-09-07 ENCOUNTER — Other Ambulatory Visit: Payer: Self-pay

## 2024-09-07 ENCOUNTER — Emergency Department
Admission: EM | Admit: 2024-09-07 | Discharge: 2024-09-07 | Disposition: A | Discharge status: Home / Self Care | Attending: Emergency Medicine | Admitting: Emergency Medicine

## 2024-09-07 ENCOUNTER — Emergency Department

## 2024-09-07 DIAGNOSIS — M79651 Pain in right thigh: Secondary | ICD-10-CM | POA: Diagnosis not present

## 2024-09-07 DIAGNOSIS — Z79899 Other long term (current) drug therapy: Secondary | ICD-10-CM | POA: Diagnosis not present

## 2024-09-07 DIAGNOSIS — M79604 Pain in right leg: Secondary | ICD-10-CM | POA: Diagnosis present

## 2024-09-07 DIAGNOSIS — I10 Essential (primary) hypertension: Secondary | ICD-10-CM | POA: Insufficient documentation

## 2024-09-07 MED ORDER — METHOCARBAMOL 500 MG PO TABS
500.0000 mg | ORAL_TABLET | Freq: Three times a day (TID) | ORAL | 0 refills | Status: AC | PRN
Start: 2024-09-07 — End: ?

## 2024-09-07 MED ORDER — HYDROCODONE-ACETAMINOPHEN 5-325 MG PO TABS
1.0000 | ORAL_TABLET | Freq: Once | ORAL | Status: AC
Start: 2024-09-07 — End: 2024-09-07
  Administered 2024-09-07: 1 via ORAL
  Filled 2024-09-07: qty 1

## 2024-09-07 MED ORDER — ONDANSETRON 4 MG PO TBDP
4.0000 mg | ORAL_TABLET | Freq: Once | ORAL | Status: AC
  Administered 2024-09-07: 4 mg via ORAL
  Filled 2024-09-07: qty 1

## 2024-09-07 NOTE — Discharge Instructions (Addendum)
 You may alternate over the counter Tylenol 1000 mg every 6 hours as needed for pain, fever and Ibuprofen 800 mg every 6-8 hours as needed for pain, fever.  Please take Ibuprofen with food.  Do not take more than 4000 mg of Tylenol (acetaminophen) in a 24 hour period.  Your ultrasound showed no blood clot.  I recommend keeping your leg elevated when at rest, stretching and following up with your primary care doctor.

## 2024-09-07 NOTE — ED Triage Notes (Signed)
 Pt reports right leg pain that began 2 weeks ago, pt reports she saw PCP for this yesterday and dx her with hip flexor strain

## 2024-09-07 NOTE — ED Provider Notes (Addendum)
 [facility] Provider Note    Event Date/Time   First MD Initiated Contact with Patient 09/07/24 [phone]     (approximate)   History   Leg Pain   HPI  Caitlin Dean is a 63 y.o. female with history of hypertension, prediabetes who presents to the emergency department with complaints of pain in the right calf and anterior thigh.  Symptoms ongoing for about 2 weeks now.  Was seen by her PCP and diagnosed with a hip flexor strain but denies any increase physical exertion or any other injury that would have led to this.  No leg swelling and no history of PE or DVT.  She denies chest pain or shortness of breath.  No fevers or chills.  No redness or warmth to the leg.  No rash.  Does have some tingling in the foot but no numbness or weakness.   History provided by patient, family.    Past Medical History:  Diagnosis Date   Hypertension    Pre-diabetes     Past Surgical History:  Procedure Laterality Date   ABDOMINAL HYSTERECTOMY     COLONOSCOPY WITH PROPOFOL  N/A 07/11/2020   Procedure: COLONOSCOPY WITH PROPOFOL ;  Surgeon: Jinny Carmine, MD;  Location: [facility];  Service: Endoscopy;  Laterality: N/A;  priority 4    MEDICATIONS:  Prior to Admission medications   Medication Sig Start Date End Date Taking? Authorizing Provider  gabapentin  (NEURONTIN ) 100 MG capsule Take 1 capsule (100 mg total) by mouth at bedtime. 12/30/23   Joshua Cathryne BROCKS, MD  hydrochlorothiazide  (HYDRODIURIL ) 25 MG tablet Take 1 tablet (25 mg total) by mouth daily. 12/30/23   Joshua Cathryne BROCKS, MD  Multiple Vitamin (MULTIVITAMIN) tablet Take 1 tablet by mouth daily.    [provider]  Omega-3 Fatty Acids (FISH OIL PO) Take by mouth daily.    [provider]  pantoprazole  (PROTONIX ) 40 MG tablet Take 1 tablet (40 mg total) by mouth daily. 12/30/23   Joshua Cathryne BROCKS, MD  potassium chloride  SA (KLOR-CON  M) 20 MEQ tablet Take 1 tablet (20 mEq total) by mouth daily.  12/30/23   Joshua Cathryne BROCKS, MD    Physical Exam   Triage Vital Signs: ED Triage Vitals  Encounter Vitals Group     BP 09/07/24 0607 (!) 169/85     Girls Systolic BP Percentile --      Girls Diastolic BP Percentile --      Boys Systolic BP Percentile --      Boys Diastolic BP Percentile --      Pulse Rate 09/07/24 0607 89     Resp 09/07/24 0607 17     Temp 09/07/24 0607 97.9 F (36.6 C)     Temp Source 09/07/24 0607 Oral     SpO2 09/07/24 0607 100 %     Weight 09/07/24 0607 160 lb (72.6 kg)     Height 09/07/24 0607 4' 11 (1.499 m)     Head Circumference --      Peak Flow --      Pain Score 09/07/24 0606 10     Pain Loc --      Pain Education --      Exclude from Growth Chart --     Most recent vital signs: Vitals:   09/07/24 0607 09/07/24 0705  BP: (!) 169/85   Pulse: 89   Resp: 17   Temp: 97.9 F (36.6 C)   SpO2: 100% 100%    CONSTITUTIONAL:  Alert, responds appropriately to questions. Well-appearing; well-nourished HEAD: Normocephalic, atraumatic EYES: Conjunctivae clear, pupils appear equal, sclera nonicteric ENT: normal nose; moist mucous membranes NECK: Supple, normal ROM CARD: RRR; S1 and S2 appreciated RESP: Normal chest excursion without splinting or tachypnea; breath sounds clear and equal bilaterally; no wheezes, no rhonchi, no rales, no hypoxia or respiratory distress, speaking full sentences ABD/GI: Non-distended; soft, non-tender, no rebound, no guarding, no peritoneal signs BACK: The back appears normal EXT: No right inguinal lymphadenopathy noted.  Tender over the right anterior thigh and posterior calf.  No soft tissue swelling, increased redness or warmth, bruising.  2+ right DP pulse.  Normal capillary refill.  Compartments in the right leg are soft.  No joint effusion. SKIN: Normal color for age and race; warm; no rash on exposed skin NEURO: Moves all extremities equally, normal speech, normal sensation diffusely, normal gait, no facial  asymmetry PSYCH: The patient's mood and manner are appropriate.   ED Results / Procedures / Treatments   LABS: (all labs ordered are listed, but only abnormal results are displayed) Labs Reviewed - No data to display   EKG:   RADIOLOGY: My personal review and interpretation of imaging: Venous Doppler shows no DVT.  I have personally reviewed all radiology reports.   US  Venous Img Lower Unilateral Right Result Date: 09/07/2024 EXAM: ULTRASOUND DUPLEX OF THE RIGHT LOWER EXTREMITY VEINS TECHNIQUE: Duplex ultrasound using B-mode/gray scaled imaging and Doppler spectral analysis and color flow was obtained of the deep venous structures of the right lower extremity. COMPARISON: None available. CLINICAL HISTORY: 63 year old female with right calf pain for 2 weeks. FINDINGS: The common femoral vein, femoral vein, popliteal vein, and visible calf veins of the right lower extremity demonstrate normal compressibility with normal color flow and spectral analysis. IMPRESSION: 1. No evidence of deep venous thrombosis in the right lower extremity. Electronically signed by: Helayne Hurst MD 09/07/2024 07:43 AM EST RP Workstation: [id number]     PROCEDURES:  Critical Care performed: No     Procedures    IMPRESSION / MDM / ASSESSMENT AND PLAN / ED COURSE  I reviewed the triage vital signs and the nursing notes.    Patient here with complaints of right leg pain.  The patient is on the cardiac monitor to evaluate for evidence of arrhythmia and/or significant heart rate changes.   DIFFERENTIAL DIAGNOSIS (includes but not limited to):   DVT, muscle strain, muscle spasm, no signs clinically of cellulitis or abscess, doubt fracture, doubt stroke  Patient's presentation is most consistent with acute presentation with potential threat to life or bodily function.   PLAN: Will obtain venous Doppler of the right leg.  Will give pain medication here.  She does have family at bedside to drive her  home if workup unremarkable.  She denies any chest pain or shortness of breath.  Afebrile without signs of infection on exam.  Strong DP pulses palpated.   MEDICATIONS GIVEN IN ED: Medications  HYDROcodone-acetaminophen (NORCO/VICODIN) 5-325 MG per tablet 1 tablet (1 tablet Oral Given 09/07/24 0648)  ondansetron (ZOFRAN-ODT) disintegrating tablet 4 mg (4 mg Oral Given 09/07/24 9351)     ED COURSE: Venous Doppler reviewed and interpreted by myself and the radiologist and shows no acute abnormality.  I feel she is safe for discharge.  Recommended alternating Tylenol and Motrin and will discharge with muscle relaxers.  Recommend keeping her leg elevated, stretching and follow-up with her PCP.   At this time, I do not feel there is  any life-threatening condition present. I reviewed all nursing notes, vitals, pertinent previous records.  All lab and urine results, EKGs, imaging ordered have been independently reviewed and interpreted by myself.  I reviewed all available radiology reports from any imaging ordered this visit.  Based on my assessment, I feel the patient is safe to be discharged home without further emergent workup and can continue workup as an outpatient as needed. Discussed all findings, treatment plan as well as usual and customary return precautions.  They verbalize understanding and are comfortable with this plan.  Outpatient follow-up has been provided as needed.  All questions have been answered.    CONSULTS: none   OUTSIDE RECORDS REVIEWED: Reviewed family medicine note from yesterday for anterior thigh pain.       FINAL CLINICAL IMPRESSION(S) / ED DIAGNOSES   Final diagnoses:  Right leg pain     Rx / DC Orders   ED Discharge Orders          Ordered    methocarbamol (ROBAXIN) 500 MG tablet  Every 8 hours PRN        09/07/24 0747             Note:  This document was prepared using Dragon voice recognition software and may include unintentional dictation  errors.   Scarleth Brame, Josette SAILOR, DO 09/07/24 9346    Ekin Pilar, Josette SAILOR, DO 09/07/24 [phone]

## 2024-10-10 ENCOUNTER — Other Ambulatory Visit: Payer: Self-pay | Admitting: Internal Medicine

## 2024-10-10 DIAGNOSIS — Z1231 Encounter for screening mammogram for malignant neoplasm of breast: Secondary | ICD-10-CM

## 2024-11-08 ENCOUNTER — Ambulatory Visit

## 2024-11-08 DIAGNOSIS — L578 Other skin changes due to chronic exposure to nonionizing radiation: Secondary | ICD-10-CM | POA: Diagnosis not present

## 2024-11-08 DIAGNOSIS — L989 Disorder of the skin and subcutaneous tissue, unspecified: Secondary | ICD-10-CM

## 2024-11-08 DIAGNOSIS — L821 Other seborrheic keratosis: Secondary | ICD-10-CM

## 2024-11-08 DIAGNOSIS — W908XXA Exposure to other nonionizing radiation, initial encounter: Secondary | ICD-10-CM | POA: Diagnosis not present

## 2024-11-08 DIAGNOSIS — L811 Chloasma: Secondary | ICD-10-CM | POA: Diagnosis not present

## 2024-11-08 DIAGNOSIS — D164 Benign neoplasm of bones of skull and face: Secondary | ICD-10-CM | POA: Diagnosis not present

## 2024-11-08 DIAGNOSIS — L814 Other melanin hyperpigmentation: Secondary | ICD-10-CM

## 2024-11-08 DIAGNOSIS — Z1283 Encounter for screening for malignant neoplasm of skin: Secondary | ICD-10-CM | POA: Diagnosis not present

## 2024-11-08 DIAGNOSIS — R21 Rash and other nonspecific skin eruption: Secondary | ICD-10-CM

## 2024-11-08 DIAGNOSIS — D229 Melanocytic nevi, unspecified: Secondary | ICD-10-CM

## 2024-11-08 DIAGNOSIS — D169 Benign neoplasm of bone and articular cartilage, unspecified: Secondary | ICD-10-CM

## 2024-11-08 DIAGNOSIS — D1801 Hemangioma of skin and subcutaneous tissue: Secondary | ICD-10-CM

## 2024-11-08 NOTE — Patient Instructions (Addendum)
 Due to recent changes in healthcare laws, you may see results of your pathology and/or laboratory studies on MyChart before the doctors have had a chance to review them. We understand that in some cases there may be results that are confusing or concerning to you. Please understand that not all results are received at the same time and often the doctors may need to interpret multiple results in order to provide you with the best plan of care or course of treatment. Therefore, we ask that you please give us  2 business days to thoroughly review all your results before contacting the office for clarification. Should we see a critical lab result, you will be contacted sooner.  Recommend Eucerin Radiant Tone for spf or corrector for dark spots for face   If You Need Anything After Your Visit  If you have any questions or concerns for your doctor, please call our main line at [phone] and press option 4 to reach your doctor's medical assistant. If no one answers, please leave a voicemail as directed and we will return your call as soon as possible. Messages left after 4 pm will be answered the following business day.   You may also send us  a message via MyChart. We typically respond to MyChart messages within 1-2 business days.  For prescription refills, please ask your pharmacy to contact our office. Our fax number is [phone].  If you have an urgent issue when the clinic is closed that cannot wait until the next business day, you can page your doctor at the number below.    Please note that while we do our best to be available for urgent issues outside of office hours, we are not available 24/7.   If you have an urgent issue and are unable to reach us , you may choose to seek medical care at your doctor's office, retail clinic, urgent care center, or emergency room.  If you have a medical emergency, please immediately call 911 or go to the emergency department.  Pager Numbers  - Dr.  Hester: [phone]  - Dr. Jackquline: [phone]  - Dr. Claudene: [phone]   - Dr. Raymund: [phone]  In the event of inclement weather, please call our main line at [phone] for an update on the status of any delays or closures.  Dermatology Medication Tips: Please keep the boxes that topical medications come in in order to help keep track of the instructions about where and how to use these. Pharmacies typically print the medication instructions only on the boxes and not directly on the medication tubes.   If your medication is too expensive, please contact our office at [phone] option 4 or send us  a message through MyChart.   We are unable to tell what your co-pay for medications will be in advance as this is different depending on your insurance coverage. However, we may be able to find a substitute medication at lower cost or fill out paperwork to get insurance to cover a needed medication.   If a prior authorization is required to get your medication covered by your insurance company, please allow us  1-2 business days to complete this process.  Drug prices often vary depending on where the prescription is filled and some pharmacies may offer cheaper prices.  The website www.goodrx.com contains coupons for medications through different pharmacies. The prices here do not account for what the cost may be with help from insurance (it may be cheaper with your insurance), but the website can give you the  price if you did not use any insurance.  - You can print the associated coupon and take it with your prescription to the pharmacy.  - You may also stop by our office during regular business hours and pick up a GoodRx coupon card.  - If you need your prescription sent electronically to a different pharmacy, notify our office through [facility] or by phone at [phone] option 4.     Si Usted Necesita Algo Despus de Su Visita  Tambin puede enviarnos un mensaje  a travs de Clinical Cytogeneticist. Por lo general respondemos a los mensajes de MyChart en el transcurso de 1 a 2 das hbiles.  Para renovar recetas, por favor pida a su farmacia que se ponga en contacto con nuestra oficina. Randi lakes de fax es [place] [phone].  Si tiene un asunto urgente cuando la clnica est cerrada y que no puede esperar hasta el siguiente da hbil, puede llamar/localizar a su doctor(a) al nmero que aparece a continuacin.   Por favor, tenga en cuenta que aunque hacemos todo lo posible para estar disponibles para asuntos urgentes fuera del horario de [place], no estamos disponibles las 24 horas del da, los 7 [address] de la [place].   Si tiene un problema urgente y no puede comunicarse con nosotros, puede optar por buscar atencin mdica  en el consultorio de su doctor(a), en una clnica privada, en un centro de atencin urgente o en una sala de emergencias.  Si tiene engineer, drilling, por favor llame inmediatamente al 911 o vaya a la sala de emergencias.  Nmeros de bper  - Dr. Hester: [phone]  - Dra. Jackquline: [phone]  - Dr. Claudene: [phone]  - Dra. Kitts: [phone]  En caso de inclemencias del [place], por favor llame a nuestra lnea principal al [phone] para una actualizacin sobre el estado de cualquier retraso o cierre.  Consejos para la medicacin en dermatologa: Por favor, guarde las cajas en las que vienen los medicamentos de uso tpico para ayudarle a seguir las instrucciones sobre dnde y cmo usarlos. Las farmacias generalmente imprimen las instrucciones del medicamento slo en las cajas y no directamente en los tubos del [place].   Si su medicamento es muy caro, por favor, pngase en contacto con landry rieger llamando al [phone] y presione la opcin 4 o envenos un mensaje a travs de Clinical Cytogeneticist.   No podemos decirle cul ser su copago por los medicamentos por adelantado ya que esto es diferente dependiendo de la cobertura de  su seguro. Sin embargo, es posible que podamos encontrar un medicamento sustituto a audiological scientist un formulario para que el seguro cubra el medicamento que se considera necesario.   Si se requiere una autorizacin previa para que su compaa de seguros cubra su medicamento, por favor permtanos de 1 a 2 das hbiles para completar este proceso.  Los precios de los medicamentos varan con frecuencia dependiendo del environmental consultant de dnde se surte la receta y alguna farmacias pueden ofrecer precios ms baratos.  El sitio web www.goodrx.com tiene cupones para medicamentos de health and safety inspector. Los precios aqu no tienen en cuenta lo que podra costar con la ayuda del seguro (puede ser ms barato con su seguro), pero el sitio web puede darle el precio si no utiliz tourist information centre manager.  - Puede imprimir el cupn correspondiente y llevarlo con su receta a la farmacia.  - Tambin puede pasar por nuestra oficina durante el horario de atencin regular y education officer, museum una tarjeta de cupones de GoodRx.  -  Si necesita que su receta se enve electrnicamente a psychiatrist, informe a nuestra oficina a travs de MyChart de [place] o por telfono llamando al [phone] y presione la opcin 4.

## 2024-11-08 NOTE — Progress Notes (Signed)
 "   Subjective   Caitlin Dean is a 64 y.o. female who presents for the following: Total body skin exam for skin cancer screening and mole check. The patient has spots, moles and lesions to be evaluated, some may be new or changing and the patient may have concern these could be cancer.. Patient is new patient  Today patient reports: Brown spot L lat canthus, may getting larger, spots on back, growth L inframammary may be enlarging, dark spot under R 2nd toenail but patient has polish on today  Review of Systems:    No other skin or systemic complaints except as noted in HPI or Assessment and Plan.  The following portions of the chart were reviewed this encounter and updated as appropriate: medications, allergies, medical history  Relevant Medical History:  n/a   Objective  (SKPE) Well appearing patient in no apparent distress; mood and affect are within normal limits. Examination was performed of the: Full Skin Examination: scalp, head, eyes, ears, nose, lips, neck, chest, axillae, abdomen, back, buttocks, bilateral upper extremities, bilateral lower extremities, hands, feet, fingers, toes, fingernails, and toenails.   Examination notable qnm:Nduznfj boney prominence right forehead, hypopigmented patches on back  - Angioma(s): Scattered red vascular papule(s) on the trunk - Lentigo/lentigines: Scattered pigmented macules that are tan to brown in color and are somewhat non-uniform in shape and concentrated in the sun-exposed areas of the trunk and upper extremities - Nevus/nevi: Scattered well-demarcated, regular, pigmented macule(s) and/or papule(s) on the scattered diffusely - Seborrheic Keratosis(es): Stuck-on appearing keratotic papule(s) on the trunk, none  irritated with redness, crusting, edema, and/or partial avulsion - Actinic Damage/Elastosis: chronic sun damage: dyspigmentation, telangiectasia, and wrinkling  Examination limited by: Undergarments and toenail  polish    Assessment & Plan  (SKAP)   SKIN CANCER SCREENING PERFORMED TODAY.  BENIGN SKIN FINDINGS  - Lentigines  - Seborrheic keratoses  - Angiomas  - Nevi  - Reassurance provided regarding the benign appearance of lesions noted on exam today; no treatment is indicated in the absence of symptoms/changes. - Reinforced importance of photoprotective strategies including liberal and frequent sunscreen use of a broad-spectrum SPF 30 or greater, use of protective clothing, and sun avoidance for prevention of cutaneous malignancy and photoaging.  Counseled patient on the importance of regular self-skin monitoring as well as routine clinical skin examinations as scheduled.   ACTINIC DAMAGE - Chronic condition, secondary to cumulative UV/sun exposure - Recommend daily broad spectrum sunscreen SPF 30+ to sun-exposed areas, reapply every 2 hours as needed.  - Staying in the shade or wearing long sleeves, sun glasses (UVA+UVB protection) and wide brim hats (4-inch brim around the entire circumference of the hat) are also recommended for sun protection.  - Call for new or changing lesions.  Osteoma L forehead - stable firm nodule on R forehead - not growing or changing  - opted to monitor - discussed imaging if growing/changing   Tinea Versicolor vs Progressive Macular Hypomelanosis (PMH), back - less likely hypopigmented MF (has been present/stable for many years)  Discussed Ketoconazole 2% shampoo and BP wash, pt defers Discussed bx   Lentigines, melasma - face - explained the etiology of the disorder, its difficulty to treat, and chronic nature - Strict sun protection with sun avoidance and tinted broad-spectrum sunscreen (with visible light protection), ideally SPF 50+, but at least 30+ (provided handout with recommendations). Warned that even 10 minutes of unprotected sun exposure while clear can flare melasma - Counseled that this is a chronic  condition that will likely resolve in later  years of life - Counseled on goals of treatment - primarily prevention of flares and minimizing pigmentation, not necessarily complete clearance - recommended diligent sun protection - Rec Eucerin thiamidol    Was sun protection counseling provided?: Yes   Level of service outlined above   Patient instructions (SKPI)   Procedures, orders, diagnosis for this visit:    There are no diagnoses linked to this encounter.  Return to clinic: Return in about 1 year (around 11/08/2025) for TBSE.  I, Grayce Saunas, RMA, am acting as scribe for Lauraine JAYSON Kanaris, MD .   Documentation: I have reviewed the above documentation for accuracy and completeness, and I agree with the above.  Lauraine JAYSON Kanaris, MD  "

## 2024-12-08 ENCOUNTER — Encounter

## 2024-12-19 ENCOUNTER — Encounter

## 2025-11-08 ENCOUNTER — Ambulatory Visit
